# Patient Record
Sex: Male | Born: 1965 | Race: Black or African American | Hispanic: No | Marital: Married | Smoking: Current every day smoker
Health system: Southern US, Community
[De-identification: ages and names within clinical notes are randomized; demographics above are authoritative.]

## PROBLEM LIST (undated history)

## (undated) DIAGNOSIS — F419 Anxiety disorder, unspecified: Secondary | ICD-10-CM

## (undated) DIAGNOSIS — R519 Headache, unspecified: Secondary | ICD-10-CM

## (undated) DIAGNOSIS — R51 Headache: Secondary | ICD-10-CM

## (undated) HISTORY — PX: OTHER SURGICAL HISTORY: SHX169

## (undated) HISTORY — DX: Headache: R51

## (undated) HISTORY — DX: Headache, unspecified: R51.9

## (undated) HISTORY — DX: Anxiety disorder, unspecified: F41.9

---

## 1999-03-30 ENCOUNTER — Emergency Department (HOSPITAL_COMMUNITY): Admission: EM | Admit: 1999-03-30 | Discharge: 1999-03-30 | Payer: Self-pay | Admitting: Emergency Medicine

## 2000-01-10 ENCOUNTER — Inpatient Hospital Stay (HOSPITAL_COMMUNITY): Admission: EM | Admit: 2000-01-10 | Discharge: 2000-01-12 | Payer: Self-pay | Admitting: Emergency Medicine

## 2000-01-10 ENCOUNTER — Encounter: Payer: Self-pay | Admitting: Emergency Medicine

## 2000-01-28 ENCOUNTER — Encounter: Payer: Self-pay | Admitting: Neurological Surgery

## 2000-01-28 ENCOUNTER — Encounter: Admission: RE | Admit: 2000-01-28 | Discharge: 2000-01-28 | Payer: Self-pay | Admitting: Neurological Surgery

## 2000-02-02 ENCOUNTER — Encounter: Payer: Self-pay | Admitting: Neurological Surgery

## 2000-02-02 ENCOUNTER — Ambulatory Visit (HOSPITAL_COMMUNITY): Admission: RE | Admit: 2000-02-02 | Discharge: 2000-02-03 | Payer: Self-pay | Admitting: Neurological Surgery

## 2000-04-16 ENCOUNTER — Encounter: Payer: Self-pay | Admitting: Neurological Surgery

## 2000-04-16 ENCOUNTER — Encounter: Admission: RE | Admit: 2000-04-16 | Discharge: 2000-04-16 | Payer: Self-pay | Admitting: Neurological Surgery

## 2000-09-08 ENCOUNTER — Encounter: Admission: RE | Admit: 2000-09-08 | Discharge: 2000-09-22 | Payer: Self-pay | Admitting: Neurological Surgery

## 2001-08-31 HISTORY — PX: JOINT REPLACEMENT: SHX530

## 2005-11-06 ENCOUNTER — Encounter: Admission: RE | Admit: 2005-11-06 | Discharge: 2005-11-06 | Payer: Self-pay | Admitting: Radiation Oncology

## 2006-01-15 ENCOUNTER — Ambulatory Visit: Payer: Self-pay | Admitting: Infectious Diseases

## 2006-07-25 ENCOUNTER — Emergency Department (HOSPITAL_COMMUNITY): Admission: EM | Admit: 2006-07-25 | Discharge: 2006-07-25 | Payer: Self-pay | Admitting: Emergency Medicine

## 2007-05-13 ENCOUNTER — Encounter: Admission: RE | Admit: 2007-05-13 | Discharge: 2007-05-13 | Payer: Self-pay | Admitting: Orthopedic Surgery

## 2008-08-23 ENCOUNTER — Emergency Department (HOSPITAL_COMMUNITY): Admission: EM | Admit: 2008-08-23 | Discharge: 2008-08-24 | Payer: Self-pay | Admitting: Emergency Medicine

## 2008-12-14 ENCOUNTER — Emergency Department (HOSPITAL_COMMUNITY): Admission: EM | Admit: 2008-12-14 | Discharge: 2008-12-15 | Payer: Self-pay | Admitting: Emergency Medicine

## 2008-12-18 ENCOUNTER — Emergency Department (HOSPITAL_COMMUNITY): Admission: EM | Admit: 2008-12-18 | Discharge: 2008-12-18 | Payer: Self-pay | Admitting: Emergency Medicine

## 2009-04-23 ENCOUNTER — Emergency Department (HOSPITAL_COMMUNITY): Admission: EM | Admit: 2009-04-23 | Discharge: 2009-04-23 | Payer: Self-pay | Admitting: Emergency Medicine

## 2011-01-11 ENCOUNTER — Emergency Department: Payer: Self-pay | Admitting: Emergency Medicine
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
## 2011-01-16 NOTE — Discharge Summary (Signed)
[place]. [facility]  Patient:    Shawn Miller, Shawn Miller                       MRN: [id number] Adm. Date:  [id number] Disc. Date: [id number] Attending:  Jonne Ply CC:         Stefani Dama, M.D.                           Discharge Summary  ADMITTING DIAGNOSIS:  Cervical fracture dislocation of C5-C6 with left C6                       radiculopathy.  DISCHARGE DIAGNOSIS:  Cervical fracture dislocation of C5-C6 with left C6                       radiculopathy.  PROCEDURE:  Anterior cervical diskectomy and arthrodesis of C5-C6, fixation with Synthes.  SURGEON:  Dr. Danielle Dess, assistant Dr. Roxan Hockey.  CONDITION ON DISCHARGE:  Improved.  HISTORY OF PRESENT ILLNESS:  The patient is a 45 year old individual who has had significant neck, shoulder and left arm pain.  He was advised regarding surgical decompression of his fracture dislocation which has progressed in degenerative spondylitic changes at the C5-C6 level.  HOSPITAL COURSE:  He was taken to the operating room where he underwent an anterior diskectomy and arthrodesis.  He tolerated the procedure well.  His incision is clean and dry.  His swallowing was minimally effected.  DISCHARGE MEDICATIONS: 1. Vicodin #60 without refills. 2. Xanax 0.5 mg #40 without refills.  FOLLOWUP:  He will be seen in the office in one months time. DD:  02/03/00 TD:  02/06/00 Job: [id number]

## 2011-02-05 ENCOUNTER — Other Ambulatory Visit: Payer: Self-pay | Admitting: Neurological Surgery

## 2011-02-05 DIAGNOSIS — R519 Headache, unspecified: Secondary | ICD-10-CM

## 2011-02-09 ENCOUNTER — Ambulatory Visit
Admission: RE | Admit: 2011-02-09 | Discharge: 2011-02-09 | Disposition: A | Discharge status: Home / Self Care | Payer: 59 | Source: Ambulatory Visit | Attending: Neurological Surgery | Admitting: Neurological Surgery

## 2011-02-09 DIAGNOSIS — R519 Headache, unspecified: Secondary | ICD-10-CM

## 2012-03-30 ENCOUNTER — Emergency Department (HOSPITAL_COMMUNITY)
Admission: EM | Admit: 2012-03-30 | Discharge: 2012-03-30 | Disposition: A | Discharge status: Home / Self Care | Payer: 59 | Attending: Emergency Medicine | Admitting: Emergency Medicine

## 2012-03-30 DIAGNOSIS — F419 Anxiety disorder, unspecified: Secondary | ICD-10-CM

## 2012-03-30 DIAGNOSIS — R002 Palpitations: Secondary | ICD-10-CM

## 2012-03-30 DIAGNOSIS — F17200 Nicotine dependence, unspecified, uncomplicated: Secondary | ICD-10-CM | POA: Insufficient documentation

## 2012-03-30 DIAGNOSIS — F411 Generalized anxiety disorder: Secondary | ICD-10-CM | POA: Insufficient documentation

## 2012-03-30 LAB — BASIC METABOLIC PANEL
Calcium: 9.2 mg/dL (ref 8.4–10.5)
Chloride: 104 mEq/L (ref 96–112)
Creatinine, Ser: 1.04 mg/dL (ref 0.50–1.35)
GFR calc Af Amer: >90 mL/min (ref >90)

## 2012-03-30 LAB — TROPONIN I: Troponin I: <0.3 ng/mL (ref <0.30)

## 2012-03-30 LAB — CBC
Platelets: 305 10*3/uL (ref 150–400)
RDW: 16.2 % — ABNORMAL HIGH (ref 11.5–15.5)
WBC: 11.5 10*3/uL — ABNORMAL HIGH (ref 4.0–10.5)

## 2012-03-30 MED ORDER — ALPRAZOLAM 0.5 MG PO TABS: 0.5000 mg | ORAL_TABLET | Freq: Three times a day (TID) | ORAL | Status: AC | PRN

## 2012-03-30 NOTE — ED Notes (Signed)
Per ems pt denies any medical hx, nkda, takes a pink pill for his stomach

## 2012-03-30 NOTE — ED Provider Notes (Addendum)
History     CSN: [id number]  Arrival date & time 03/30/12  1449   First MD Initiated Contact with Patient 03/30/12 1735      Chief Complaint  Patient presents with  . Irregular Heart Beat    (Consider location/radiation/quality/duration/timing/severity/associated sxs/prior treatment) The history is provided by the patient.   he has a history of anxiety, and he smokes cigarettes.  He presents to emergency department complaining of an anxiety attack.  That began.  This morning.  He, says that he had a bite a [employer] burger and April.  Refill, and then, his body became, and tingling.  He said it was 10 times worse than his normal.  Anxiety attacks.  His numbness was associated with palpitations.  He denied pain anywhere.  He denied nausea, or vomiting, lightheadedness, or shortness of breath.  He, says his symptoms lasted from this morning until around 3:00.  He, says his symptoms are gone now.  He denies recent illness.  He denies cough, fevers, chills, sweating, rash, swelling, or weight loss.  He takes no medicines and has no allergies to medications  No past medical history on file.  No past surgical history on file.  No family history on file.  History  Substance Use Topics  . Smoking status: Not on file  . Smokeless tobacco: Not on file  . Alcohol Use: Not on file      Review of Systems  Constitutional: Negative for fever, chills and diaphoresis.  HENT: Negative for neck pain.   Eyes: Negative for redness.  Respiratory: Negative for cough and shortness of breath.   Cardiovascular: Positive for palpitations. Negative for chest pain and leg swelling.  Gastrointestinal: Negative for nausea, vomiting and abdominal pain.  Genitourinary: Negative for dysuria.  Musculoskeletal: Positive for joint swelling. Negative for back pain.  Skin: Negative for rash.  Neurological: Positive for numbness. Negative for weakness and headaches.  Hematological: Negative for adenopathy. Does  not bruise/bleed easily.  Psychiatric/Behavioral: Negative for confusion.  All other systems reviewed and are negative.    Allergies  Review of patient's allergies indicates not on file.  Home Medications  No current outpatient prescriptions on file.  BP 110/73  Pulse 84  Temp 97.8 F (36.6 C) (Oral)  Resp 16  SpO2 98%  Physical Exam  Nursing note and vitals reviewed. Constitutional: He is oriented to person, place, and time. He appears well-developed and well-nourished. No distress.  HENT:  Head: Normocephalic and atraumatic.  Eyes: Conjunctivae are normal.  Neck: Normal range of motion. Neck supple. No tracheal deviation present.  Cardiovascular: Normal rate and regular rhythm.   No murmur heard. Pulmonary/Chest: Effort normal and breath sounds normal. No respiratory distress. He has no wheezes. He has no rales.  Abdominal: Soft. Bowel sounds are normal. He exhibits no distension. There is no tenderness. There is no rebound and no guarding.  Musculoskeletal: Normal range of motion. He exhibits no edema and no tenderness.  Neurological: He is alert and oriented to person, place, and time.  Skin: Skin is warm and dry.  Psychiatric: He has a normal mood and affect. Thought content normal.    ED Course  Procedures (including critical care time) 46 year old, male, with a history of anxiety attacks, presents to emergency department following an anxiety attack.  Today.  He is asymptomatic.  Right now.  His physical examination is normal.  Right.  Now.  We will perform laboratory testing, for evaluation.  Since they have party.  Been ordered prior  to my interviewing the patient  Labs Reviewed  CBC - Abnormal; Notable for the following:    WBC 11.5 (*)     RDW 16.2 (*)     All other components within normal limits  TROPONIN I  BASIC METABOLIC PANEL   No results found.   No diagnosis found.  ECG. Normal sinus rhythm at 73 beats per minute. Normal axis. Normal  intervals. Nonspecific very subtle ST segment elevation. Impression normal sinus rhythm with no significant change from 07/25/2006  MDM  Anxiety reaction, resolved No evidence of acs or pulm dz.  asx in ed. Nl pe in ed        Cheri Guppy, MD 03/30/12 1751  Cheri Guppy, MD 03/30/12 [phone]

## 2012-03-30 NOTE — ED Notes (Signed)
Patient claims "I'm shaking all over on the inside, I'm very anxious.  I have a lot of anxiety".

## 2012-03-30 NOTE — ED Notes (Signed)
Tech at bedside for EKG.
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
## 2012-03-30 NOTE — ED Notes (Signed)
Care transferred and report given to Strickland, RN. 

## 2015-08-12 ENCOUNTER — Ambulatory Visit (INDEPENDENT_AMBULATORY_CARE_PROVIDER_SITE_OTHER): Payer: 59 | Admitting: Family Medicine

## 2015-08-12 VITALS — BP 122/72 | HR 87 | Temp 98.5°F | Resp 17 | Ht 74.5 in | Wt 258.0 lb

## 2015-08-12 DIAGNOSIS — Z299 Encounter for prophylactic measures, unspecified: Secondary | ICD-10-CM | POA: Diagnosis not present

## 2015-08-12 DIAGNOSIS — F411 Generalized anxiety disorder: Secondary | ICD-10-CM

## 2015-08-12 MED ORDER — ALPRAZOLAM 0.5 MG PO TBDP: 0.5000 mg | ORAL_TABLET | Freq: Every day | ORAL | Status: DC | PRN
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
## 2015-08-12 NOTE — Patient Instructions (Signed)

## 2015-10-21 ENCOUNTER — Encounter: Payer: 59 | Admitting: Family Medicine

## 2015-10-23 ENCOUNTER — Encounter: Payer: 59 | Admitting: Family Medicine

## 2015-10-30 ENCOUNTER — Ambulatory Visit (INDEPENDENT_AMBULATORY_CARE_PROVIDER_SITE_OTHER): Payer: 59 | Admitting: Family Medicine

## 2015-10-30 ENCOUNTER — Encounter: Payer: Self-pay | Admitting: Family Medicine

## 2015-10-30 VITALS — BP 106/64 | HR 86 | Temp 98.0°F | Resp 16 | Ht 73.25 in | Wt 251.4 lb

## 2015-10-30 DIAGNOSIS — Z13220 Encounter for screening for lipoid disorders: Secondary | ICD-10-CM

## 2015-10-30 DIAGNOSIS — Z1159 Encounter for screening for other viral diseases: Secondary | ICD-10-CM | POA: Diagnosis not present

## 2015-10-30 DIAGNOSIS — Z125 Encounter for screening for malignant neoplasm of prostate: Secondary | ICD-10-CM

## 2015-10-30 DIAGNOSIS — Z0000 Encounter for general adult medical examination without abnormal findings: Secondary | ICD-10-CM

## 2015-10-30 DIAGNOSIS — F411 Generalized anxiety disorder: Secondary | ICD-10-CM

## 2015-10-30 DIAGNOSIS — E669 Obesity, unspecified: Secondary | ICD-10-CM | POA: Diagnosis not present

## 2015-10-30 DIAGNOSIS — Z131 Encounter for screening for diabetes mellitus: Secondary | ICD-10-CM | POA: Diagnosis not present

## 2015-10-30 DIAGNOSIS — Z1211 Encounter for screening for malignant neoplasm of colon: Secondary | ICD-10-CM | POA: Diagnosis not present

## 2015-10-30 DIAGNOSIS — Z720 Tobacco use: Secondary | ICD-10-CM | POA: Diagnosis not present

## 2015-10-30 DIAGNOSIS — R142 Eructation: Secondary | ICD-10-CM | POA: Diagnosis not present

## 2015-10-30 LAB — COMPLETE METABOLIC PANEL WITH GFR
ALT: 27 U/L (ref 9–46)
AST: 19 U/L (ref 10–35)
Albumin: 4.7 g/dL (ref 3.6–5.1)
Alkaline Phosphatase: 104 U/L (ref 40–115)
BILIRUBIN TOTAL: 0.3 mg/dL (ref 0.2–1.2)
BUN: 16 mg/dL (ref 7–25)
CALCIUM: 9.3 mg/dL (ref 8.6–10.3)
CHLORIDE: 103 mmol/L (ref 98–110)
CO2: 26 mmol/L (ref 20–31)
CREATININE: 1.17 mg/dL (ref 0.70–1.33)
GFR, EST AFRICAN AMERICAN: 83 mL/min (ref >=60)
GFR, Est Non African American: 72 mL/min (ref >=60)
Glucose, Bld: 78 mg/dL (ref 65–99)
Potassium: 4.5 mmol/L (ref 3.5–5.3)
Sodium: 140 mmol/L (ref 135–146)
TOTAL PROTEIN: 7.8 g/dL (ref 6.1–8.1)

## 2015-10-30 LAB — LIPID PANEL
CHOLESTEROL: 206 mg/dL — AB (ref 125–200)
HDL: 38 mg/dL — ABNORMAL LOW (ref >=40)
LDL CALC: 142 mg/dL — AB (ref <130)
TRIGLYCERIDES: 131 mg/dL (ref <150)
Total CHOL/HDL Ratio: 5.4 Ratio — ABNORMAL HIGH (ref <=5.0)
VLDL: 26 mg/dL (ref <30)

## 2015-10-30 NOTE — Patient Instructions (Addendum)
Avoid foods below that can cause heartburn and belching.  If not improving your symptoms in the next few weeks - follow up to discuss these symptoms further.   See information on anxiety below. Continue exercise to help manage anxiety and weight loss. Decrease fast food and sugar containing beverages.  Follow up in next 3 months to discuss this further.   You should receive a call or letter about your lab results within the next week to 10 days.   I will refer you for colonoscopy.   Return to the clinic or go to the nearest emergency room if any of your symptoms worsen or new symptoms occur.  [place] offers smoking cessation clinics. Registration is required. To register call [phone] or register online at [email or web address].  Food Choices for Gastroesophageal Reflux Disease, Adult When you have gastroesophageal reflux disease (GERD), the foods you eat and your eating habits are very important. Choosing the right foods can help ease the discomfort of GERD. WHAT GENERAL GUIDELINES DO I NEED TO FOLLOW?  Choose fruits, vegetables, whole grains, low-fat dairy products, and low-fat meat, fish, and poultry.  Limit fats such as oils, salad dressings, butter, nuts, and avocado.  Keep a food diary to identify foods that cause symptoms.  Avoid foods that cause reflux. These may be different for different people.  Eat frequent small meals instead of three large meals each day.  Eat your meals slowly, in a relaxed setting.  Limit fried foods.  Cook foods using methods other than frying.  Avoid drinking alcohol.  Avoid drinking large amounts of liquids with your meals.  Avoid bending over or lying down until 2-3 hours after eating. WHAT FOODS ARE NOT RECOMMENDED? The following are some foods and drinks that may worsen your symptoms: Vegetables Tomatoes. Tomato juice. Tomato and spaghetti sauce. Chili peppers. Onion and garlic. Horseradish. Fruits Oranges, grapefruit, and lemon (fruit  and juice). Meats High-fat meats, fish, and poultry. This includes hot dogs, ribs, ham, sausage, salami, and bacon. Dairy Whole milk and chocolate milk. Sour cream. Cream. Butter. Ice cream. Cream cheese.  Beverages Coffee and tea, with or without caffeine. Carbonated beverages or energy drinks. Condiments Hot sauce. Barbecue sauce.  Sweets/Desserts Chocolate and cocoa. Donuts. Peppermint and spearmint. Fats and Oils High-fat foods, including [place] fries and potato chips. Other Vinegar. Strong spices, such as black pepper, white pepper, red pepper, cayenne, curry powder, cloves, ginger, and chili powder. The items listed above may not be a complete list of foods and beverages to avoid. Contact your dietitian for more information.   This information is not intended to replace advice given to you by your health care provider. Make sure you discuss any questions you have with your health care provider.   Document Released: 08/17/2005 Document Revised: 09/07/2014 Document Reviewed: 06/21/2013 Elsevier Interactive Patient Education 2016 [employer].   Smoking Cessation, Tips for Success If you are ready to quit smoking, congratulations! You have chosen to help yourself be healthier. Cigarettes bring nicotine, tar, carbon monoxide, and other irritants into your body. Your lungs, heart, and blood vessels will be able to work better without these poisons. There are many different ways to quit smoking. Nicotine gum, nicotine patches, a nicotine inhaler, or nicotine nasal spray can help with physical craving. Hypnosis, support groups, and medicines help break the habit of smoking. WHAT THINGS CAN I DO TO MAKE QUITTING EASIER?  Here are some tips to help you quit for good:  Pick a date when you will quit  smoking completely. Tell all of your friends and family about your plan to quit on that date.  Do not try to slowly cut down on the number of cigarettes you are smoking. Pick a quit date and  quit smoking completely starting on that day.  Throw away all cigarettes.   Clean and remove all ashtrays from your home, work, and car.  On a card, write down your reasons for quitting. Carry the card with you and read it when you get the urge to smoke.  Cleanse your body of nicotine. Drink enough water and fluids to keep your urine clear or pale yellow. Do this after quitting to flush the nicotine from your body.  Learn to predict your moods. Do not let a bad situation be your excuse to have a cigarette. Some situations in your life might tempt you into wanting a cigarette.  Never have "just one" cigarette. It leads to wanting another and another. Remind yourself of your decision to quit.  Change habits associated with smoking. If you smoked while driving or when feeling stressed, try other activities to replace smoking. Stand up when drinking your coffee. Brush your teeth after eating. Sit in a different chair when you read the paper. Avoid alcohol while trying to quit, and try to drink fewer caffeinated beverages. Alcohol and caffeine may urge you to smoke.  Avoid foods and drinks that can trigger a desire to smoke, such as sugary or spicy foods and alcohol.  Ask people who smoke not to smoke around you.  Have something planned to do right after eating or having a cup of coffee. For example, plan to take a walk or exercise.  Try a relaxation exercise to calm you down and decrease your stress. Remember, you may be tense and nervous for the first 2 weeks after you quit, but this will pass.  Find new activities to keep your hands busy. Play with a pen, coin, or rubber band. Doodle or draw things on paper.  Brush your teeth right after eating. This will help cut down on the craving for the taste of tobacco after meals. You can also try mouthwash.   Use oral substitutes in place of cigarettes. Try using lemon drops, carrots, cinnamon sticks, or chewing gum. Keep them handy so they are  available when you have the urge to smoke.  When you have the urge to smoke, try deep breathing.  Designate your home as a nonsmoking area.  If you are a heavy smoker, ask your health care provider about a prescription for nicotine chewing gum. It can ease your withdrawal from nicotine.  Reward yourself. Set aside the cigarette money you save and buy yourself something nice.  Look for support from others. Join a support group or smoking cessation program. Ask someone at home or at work to help you with your plan to quit smoking.  Always ask yourself, "Do I need this cigarette or is this just a reflex?" Tell yourself, "Today, I choose not to smoke," or "I do not want to smoke." You are reminding yourself of your decision to quit.  Do not replace cigarette smoking with electronic cigarettes (commonly called e-cigarettes). The safety of e-cigarettes is unknown, and some may contain harmful chemicals.  If you relapse, do not give up! Plan ahead and think about what you will do the next time you get the urge to smoke. HOW WILL I FEEL WHEN I QUIT SMOKING? You may have symptoms of withdrawal because your body is  used to nicotine (the addictive substance in cigarettes). You may crave cigarettes, be irritable, feel very hungry, cough often, get headaches, or have difficulty concentrating. The withdrawal symptoms are only temporary. They are strongest when you first quit but will go away within 10-14 days. When withdrawal symptoms occur, stay in control. Think about your reasons for quitting. Remind yourself that these are signs that your body is healing and getting used to being without cigarettes. Remember that withdrawal symptoms are easier to treat than the major diseases that smoking can cause.  Even after the withdrawal is over, expect periodic urges to smoke. However, these cravings are generally short lived and will go away whether you smoke or not. Do not smoke! WHAT RESOURCES ARE AVAILABLE TO  HELP ME QUIT SMOKING? Your health care provider can direct you to community resources or hospitals for support, which may include:  Group support.  Education.  Hypnosis.  Therapy.   This information is not intended to replace advice given to you by your health care provider. Make sure you discuss any questions you have with your health care provider.   Document Released: 05/15/2004 Document Revised: 09/07/2014 Document Reviewed: 02/02/2013 Elsevier Interactive Patient Education 2016 [employer].   Keeping you healthy  Get these tests  Blood pressure- Have your blood pressure checked once a year by your healthcare provider.  Normal blood pressure is 120/80  Weight- Have your body mass index (BMI) calculated to screen for obesity.  BMI is a measure of body fat based on height and weight. You can also calculate your own BMI at [email or web address].  Cholesterol- Have your cholesterol checked every year.  Diabetes- Have your blood sugar checked regularly if you have high blood pressure, high cholesterol, have a family history of diabetes or if you are overweight.  Screening for Colon Cancer- Colonoscopy starting at age 23.  Screening may begin sooner depending on your family history and other health conditions. Follow up colonoscopy as directed by your Gastroenterologist.  Screening for Prostate Cancer- Both blood work (PSA) and a rectal exam help screen for Prostate Cancer.  Screening begins at age 38 with African-American men and at age 71 with Caucasian men.  Screening may begin sooner depending on your family history.  Take these medicines  Aspirin- One aspirin daily can help prevent Heart disease and Stroke.  Flu shot- Every fall.  Tetanus- Every 10 years.  Zostavax- Once after the age of 75 to prevent Shingles.  Pneumonia shot- Once after the age of 62; if you are younger than 3, ask your healthcare provider if you need a Pneumonia shot.  Take these  steps  Don't smoke- If you do smoke, talk to your doctor about quitting.  For tips on how to quit, go to www.smokefree.gov or call 1-800-QUIT-NOW.  Be physically active- Exercise 5 days a week for at least 30 minutes.  If you are not already physically active start slow and gradually work up to 30 minutes of moderate physical activity.  Examples of moderate activity include walking briskly, mowing the yard, dancing, swimming, bicycling, etc.  Eat a healthy diet- Eat a variety of healthy food such as fruits, vegetables, low fat milk, low fat cheese, yogurt, lean meant, poultry, fish, beans, tofu, etc. For more information go to www.thenutritionsource.org  Drink alcohol in moderation- Limit alcohol intake to less than two drinks a day. Never drink and drive.  Dentist- Brush and floss twice daily; visit your dentist twice a year.  Depression- Your emotional health is  as important as your physical health. If you're feeling down, or losing interest in things you would normally enjoy please talk to your healthcare provider.  Eye exam- Visit your eye doctor every year.  Safe sex- If you may be exposed to a sexually transmitted infection, use a condom.  Seat belts- Seat belts can save your life; always wear one.  Smoke/Carbon Monoxide detectors- These detectors need to be installed on the appropriate level of your home.  Replace batteries at least once a year.  Skin cancer- When out in the sun, cover up and use sunscreen 15 SPF or higher.  Violence- If anyone is threatening you, please tell your healthcare provider.  Living Will/ Health care power of attorney- Speak with your healthcare provider and family.

## 2015-10-30 NOTE — Progress Notes (Addendum)
Subjective:    Patient ID: Shawn Miller, male    DOB: 10-25-1965, 50 y.o.   MRN: [id number] By signing my name below, I, Littie Deeds, attest that this documentation has been prepared under the direction and in the presence of Meredith Staggers, MD.  Electronically Signed: Littie Deeds, Medical Scribe. 10/30/2015. 12:36 PM.  HPI HPI Comments: Shawn Miller is a 50 y.o. male who presents to the [facility] for a complete physical exam. He is a new patient to me. Previously followed by Dr. Milus Glazier. He has a history of anxiety. Last seen in December. Uses Xanax as needed. Has used Niravam version (oral dissolvable tablet). Last prescription for Niravam on December 12th, #30 and 1 refill. Patient is fasting today.  Cancer screening:  Colon cancer screening - He did have a colonoscopy several years ago, but he does not remember who performed it. He is due for another colonoscopy and would like [place] to make a referral. Prostate cancer screening - Patient agrees to prostate cancer screening today.  Immunizations: He declines flu shot. He states his last tetanus was about 6 years ago.  Depression screening:  Depression screen [facility] 2/9 10/30/2015 08/12/2015  Decreased Interest 0 0  Down, Depressed, Hopeless 0 0  PHQ - 2 Score 0 0   Fall screening: 0 falls in the past year.  Vision: Patient states he is due to see his eye doctor.  Visual Acuity Screening   Right eye Left eye Both eyes  Without correction:     With correction: 20/20 20/20 20/20     Exercise: Walks twice a week, 1-2 miles at a time.  Diet: He reports eating fast food about 3 times a week and drinking about 3 cans of ginger ale a day.  Dentist: He sees a Education officer, community every year.  HIV screening: He was tested for HIV when he was married. He has not had any new sexual partners since then.  Hepatitis C screening: He does not believe he has been tested for hepatitis C.  Lipid screening: No recent lab  work.  Belching: Patient has had increased belching over the past few months. He believes this may be due to his anxiety. Patient denies heartburn.  Anxiety: He takes the Niravam about 1-2 times a week which has been helping to control his anxiety. He has not yet refilled the Niravam. Patient denies side effects. The walking also helps with his anxiety.  Smoking cessation: Patient has been working on quitting smoking and is down to 0.5 ppd. He cannot tolerate the nicotine gum and notes the Chantix gives him nightmares.  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  There are no active problems to display for this patient.  Past Medical History  Diagnosis Date  . Anxiety    Past Surgical History  Procedure Laterality Date  . Joint replacement  2003    per patient neck-3 screws and 3 plate   No Known Allergies Prior to Admission medications   Medication Sig Start Date End Date Taking? Authorizing Provider  ALPRAZolam (NIRAVAM) 0.5 MG dissolvable tablet Take 1 tablet (0.5 mg total) by mouth daily as needed for anxiety. 08/12/15  Yes Elvina Sidle, MD   Social History   Social History  . Marital Status: Married    Spouse Name: N/A  . Number of Children: N/A  . Years of Education: N/A   Occupational History  . transportation    Social History Main Topics  .  Smoking status: Current Every Day Smoker -- 0.50 packs/day for 33 years    Types: Cigarettes  . Smokeless tobacco: Not on file  . Alcohol Use: 3.0 oz/week    5 Standard drinks or equivalent per week     Comment: liquor and beer  . Drug Use: No  . Sexual Activity: Not on file   Other Topics Concern  . Not on file   Social History Narrative   Exercise walking 2 miles two times/week     Review of Systems  HENT: Positive for tinnitus.   Musculoskeletal: Positive for neck stiffness.  All other systems reviewed and are negative. 13 point ROS reviewed on patient health survey. Negative other  than listed above or in nursing note. See nursing note.     Objective:   Physical Exam  Constitutional: He is oriented to person, place, and time. He appears well-developed and well-nourished.  HENT:  Head: Normocephalic and atraumatic.  Right Ear: External ear normal.  Left Ear: External ear normal.  Mouth/Throat: Oropharynx is clear and moist.  Eyes: Conjunctivae and EOM are normal. Pupils are equal, round, and reactive to light.  Neck: Normal range of motion. Neck supple. No thyromegaly present.  Cardiovascular: Normal rate, regular rhythm, normal heart sounds and intact distal pulses.   Pulmonary/Chest: Effort normal and breath sounds normal. No respiratory distress. He has no wheezes.  Abdominal: Soft. He exhibits no distension. There is no tenderness. Hernia confirmed negative in the right inguinal area and confirmed negative in the left inguinal area.  Genitourinary: Prostate normal.  Musculoskeletal: Normal range of motion. He exhibits no edema or tenderness.  Lymphadenopathy:    He has no cervical adenopathy.  Neurological: He is alert and oriented to person, place, and time. He has normal reflexes.  Skin: Skin is warm and dry.  Psychiatric: He has a normal mood and affect. His behavior is normal.  Vitals reviewed.   Filed Vitals:   10/30/15 1129  BP: 106/64  Pulse: 86  Temp: 98 F (36.7 C)  TempSrc: Oral  Resp: 16  Height: 6' 1.25" (1.861 m)  Weight: 251 lb 6.4 oz (114.034 kg)  SpO2: 98%  Body mass index is 32.93 kg/(m^2).       Assessment & Plan:  Shawn Miller is a 50 y.o. male Annual physical exam  --anticipatory guidance as below in AVS, screening labs above. Health maintenance items as above in HPI discussed/recommended as applicable.   Anxiety state  -with intermittent dosing with Niravam. Management techniques discussed.  Screening for diabetes mellitus - Plan: COMPLETE METABOLIC PANEL WITH GFR  Screening for hyperlipidemia - Plan: Lipid  panel  Obesity  -Increased activity, portion control.  Recheck 3 months.  Need for hepatitis C screening test - hep C antibody  Colon cancer screening  -Refer to GI for screening.   Belching  - May be related to reflux. Avoid trigger foods as below, return to discuss further if persistent.  Screening for prostate cancer - Plan: PSA  -We discussed pros and cons of prostate cancer screening, and after this discussion, he chose to have screening done. PSA obtained, and no concerning findings on DRE.   Tobacco abuse  -Cessation discussed. Resources provided.   No orders of the defined types were placed in this encounter.   Patient Instructions  Avoid foods below that can cause heartburn and belching.  If not improving your symptoms in the next few weeks - follow up to discuss these symptoms further.   See  information on anxiety below. Continue exercise to help manage anxiety and weight loss. Decrease fast food and sugar containing beverages.  Follow up in next 3 months to discuss this further.   You should receive a call or letter about your lab results within the next week to 10 days.   I will refer you for colonoscopy.   Return to the clinic or go to the nearest emergency room if any of your symptoms worsen or new symptoms occur.  [place] offers smoking cessation clinics. Registration is required. To register call [phone] or register online at [email or web address].  Food Choices for Gastroesophageal Reflux Disease, Adult When you have gastroesophageal reflux disease (GERD), the foods you eat and your eating habits are very important. Choosing the right foods can help ease the discomfort of GERD. WHAT GENERAL GUIDELINES DO I NEED TO FOLLOW?  Choose fruits, vegetables, whole grains, low-fat dairy products, and low-fat meat, fish, and poultry.  Limit fats such as oils, salad dressings, butter, nuts, and avocado.  Keep a food diary to identify foods that cause  symptoms.  Avoid foods that cause reflux. These may be different for different people.  Eat frequent small meals instead of three large meals each day.  Eat your meals slowly, in a relaxed setting.  Limit fried foods.  Cook foods using methods other than frying.  Avoid drinking alcohol.  Avoid drinking large amounts of liquids with your meals.  Avoid bending over or lying down until 2-3 hours after eating. WHAT FOODS ARE NOT RECOMMENDED? The following are some foods and drinks that may worsen your symptoms: Vegetables Tomatoes. Tomato juice. Tomato and spaghetti sauce. Chili peppers. Onion and garlic. Horseradish. Fruits Oranges, grapefruit, and lemon (fruit and juice). Meats High-fat meats, fish, and poultry. This includes hot dogs, ribs, ham, sausage, salami, and bacon. Dairy Whole milk and chocolate milk. Sour cream. Cream. Butter. Ice cream. Cream cheese.  Beverages Coffee and tea, with or without caffeine. Carbonated beverages or energy drinks. Condiments Hot sauce. Barbecue sauce.  Sweets/Desserts Chocolate and cocoa. Donuts. Peppermint and spearmint. Fats and Oils High-fat foods, including [place] fries and potato chips. Other Vinegar. Strong spices, such as black pepper, white pepper, red pepper, cayenne, curry powder, cloves, ginger, and chili powder. The items listed above may not be a complete list of foods and beverages to avoid. Contact your dietitian for more information.   This information is not intended to replace advice given to you by your health care provider. Make sure you discuss any questions you have with your health care provider.   Document Released: 08/17/2005 Document Revised: 09/07/2014 Document Reviewed: 06/21/2013 Elsevier Interactive Patient Education 2016 [employer].   Smoking Cessation, Tips for Success If you are ready to quit smoking, congratulations! You have chosen to help yourself be healthier. Cigarettes bring nicotine, tar,  carbon monoxide, and other irritants into your body. Your lungs, heart, and blood vessels will be able to work better without these poisons. There are many different ways to quit smoking. Nicotine gum, nicotine patches, a nicotine inhaler, or nicotine nasal spray can help with physical craving. Hypnosis, support groups, and medicines help break the habit of smoking. WHAT THINGS CAN I DO TO MAKE QUITTING EASIER?  Here are some tips to help you quit for good:  Pick a date when you will quit smoking completely. Tell all of your friends and family about your plan to quit on that date.  Do not try to slowly cut down on the number of cigarettes  you are smoking. Pick a quit date and quit smoking completely starting on that day.  Throw away all cigarettes.   Clean and remove all ashtrays from your home, work, and car.  On a card, write down your reasons for quitting. Carry the card with you and read it when you get the urge to smoke.  Cleanse your body of nicotine. Drink enough water and fluids to keep your urine clear or pale yellow. Do this after quitting to flush the nicotine from your body.  Learn to predict your moods. Do not let a bad situation be your excuse to have a cigarette. Some situations in your life might tempt you into wanting a cigarette.  Never have "just one" cigarette. It leads to wanting another and another. Remind yourself of your decision to quit.  Change habits associated with smoking. If you smoked while driving or when feeling stressed, try other activities to replace smoking. Stand up when drinking your coffee. Brush your teeth after eating. Sit in a different chair when you read the paper. Avoid alcohol while trying to quit, and try to drink fewer caffeinated beverages. Alcohol and caffeine may urge you to smoke.  Avoid foods and drinks that can trigger a desire to smoke, such as sugary or spicy foods and alcohol.  Ask people who smoke not to smoke around you.  Have  something planned to do right after eating or having a cup of coffee. For example, plan to take a walk or exercise.  Try a relaxation exercise to calm you down and decrease your stress. Remember, you may be tense and nervous for the first 2 weeks after you quit, but this will pass.  Find new activities to keep your hands busy. Play with a pen, coin, or rubber band. Doodle or draw things on paper.  Brush your teeth right after eating. This will help cut down on the craving for the taste of tobacco after meals. You can also try mouthwash.   Use oral substitutes in place of cigarettes. Try using lemon drops, carrots, cinnamon sticks, or chewing gum. Keep them handy so they are available when you have the urge to smoke.  When you have the urge to smoke, try deep breathing.  Designate your home as a nonsmoking area.  If you are a heavy smoker, ask your health care provider about a prescription for nicotine chewing gum. It can ease your withdrawal from nicotine.  Reward yourself. Set aside the cigarette money you save and buy yourself something nice.  Look for support from others. Join a support group or smoking cessation program. Ask someone at home or at work to help you with your plan to quit smoking.  Always ask yourself, "Do I need this cigarette or is this just a reflex?" Tell yourself, "Today, I choose not to smoke," or "I do not want to smoke." You are reminding yourself of your decision to quit.  Do not replace cigarette smoking with electronic cigarettes (commonly called e-cigarettes). The safety of e-cigarettes is unknown, and some may contain harmful chemicals.  If you relapse, do not give up! Plan ahead and think about what you will do the next time you get the urge to smoke. HOW WILL I FEEL WHEN I QUIT SMOKING? You may have symptoms of withdrawal because your body is used to nicotine (the addictive substance in cigarettes). You may crave cigarettes, be irritable, feel very hungry,  cough often, get headaches, or have difficulty concentrating. The withdrawal symptoms are only  temporary. They are strongest when you first quit but will go away within 10-14 days. When withdrawal symptoms occur, stay in control. Think about your reasons for quitting. Remind yourself that these are signs that your body is healing and getting used to being without cigarettes. Remember that withdrawal symptoms are easier to treat than the major diseases that smoking can cause.  Even after the withdrawal is over, expect periodic urges to smoke. However, these cravings are generally short lived and will go away whether you smoke or not. Do not smoke! WHAT RESOURCES ARE AVAILABLE TO HELP ME QUIT SMOKING? Your health care provider can direct you to community resources or hospitals for support, which may include:  Group support.  Education.  Hypnosis.  Therapy.   This information is not intended to replace advice given to you by your health care provider. Make sure you discuss any questions you have with your health care provider.   Document Released: 05/15/2004 Document Revised: 09/07/2014 Document Reviewed: 02/02/2013 Elsevier Interactive Patient Education 2016 [employer].   Keeping you healthy  Get these tests  Blood pressure- Have your blood pressure checked once a year by your healthcare provider.  Normal blood pressure is 120/80  Weight- Have your body mass index (BMI) calculated to screen for obesity.  BMI is a measure of body fat based on height and weight. You can also calculate your own BMI at [email or web address].  Cholesterol- Have your cholesterol checked every year.  Diabetes- Have your blood sugar checked regularly if you have high blood pressure, high cholesterol, have a family history of diabetes or if you are overweight.  Screening for Colon Cancer- Colonoscopy starting at age 45.  Screening may begin sooner depending on your family history and other health  conditions. Follow up colonoscopy as directed by your Gastroenterologist.  Screening for Prostate Cancer- Both blood work (PSA) and a rectal exam help screen for Prostate Cancer.  Screening begins at age 51 with African-American men and at age 84 with Caucasian men.  Screening may begin sooner depending on your family history.  Take these medicines  Aspirin- One aspirin daily can help prevent Heart disease and Stroke.  Flu shot- Every fall.  Tetanus- Every 10 years.  Zostavax- Once after the age of 58 to prevent Shingles.  Pneumonia shot- Once after the age of 3; if you are younger than 59, ask your healthcare provider if you need a Pneumonia shot.  Take these steps  Don't smoke- If you do smoke, talk to your doctor about quitting.  For tips on how to quit, go to www.smokefree.gov or call 1-800-QUIT-NOW.  Be physically active- Exercise 5 days a week for at least 30 minutes.  If you are not already physically active start slow and gradually work up to 30 minutes of moderate physical activity.  Examples of moderate activity include walking briskly, mowing the yard, dancing, swimming, bicycling, etc.  Eat a healthy diet- Eat a variety of healthy food such as fruits, vegetables, low fat milk, low fat cheese, yogurt, lean meant, poultry, fish, beans, tofu, etc. For more information go to www.thenutritionsource.org  Drink alcohol in moderation- Limit alcohol intake to less than two drinks a day. Never drink and drive.  Dentist- Brush and floss twice daily; visit your dentist twice a year.  Depression- Your emotional health is as important as your physical health. If you're feeling down, or losing interest in things you would normally enjoy please talk to your healthcare provider.  Eye exam- Visit your eye  doctor every year.  Safe sex- If you may be exposed to a sexually transmitted infection, use a condom.  Seat belts- Seat belts can save your life; always wear one.  Smoke/Carbon  Monoxide detectors- These detectors need to be installed on the appropriate level of your home.  Replace batteries at least once a year.  Skin cancer- When out in the sun, cover up and use sunscreen 15 SPF or higher.  Violence- If anyone is threatening you, please tell your healthcare provider.  Living Will/ Health care power of attorney- Speak with your healthcare provider and family.    I personally performed the services described in this documentation, which was scribed in my presence. The recorded information has been reviewed and considered, and addended by me as needed.

## 2015-10-31 LAB — PSA: PSA: 1.04 ng/mL (ref <=4.00)

## 2015-11-06 ENCOUNTER — Encounter: Payer: 59 | Admitting: Family Medicine

## 2016-07-20 ENCOUNTER — Other Ambulatory Visit: Payer: Self-pay | Admitting: Family Medicine

## 2016-07-20 ENCOUNTER — Ambulatory Visit: Payer: 59

## 2016-07-20 DIAGNOSIS — F411 Generalized anxiety disorder: Secondary | ICD-10-CM

## 2016-07-21 ENCOUNTER — Ambulatory Visit (INDEPENDENT_AMBULATORY_CARE_PROVIDER_SITE_OTHER): Payer: 59 | Admitting: Urgent Care

## 2016-07-21 VITALS — BP 132/80 | HR 85 | Temp 97.6°F | Resp 17 | Ht 73.25 in | Wt 250.0 lb

## 2016-07-21 DIAGNOSIS — F410 Panic disorder [episodic paroxysmal anxiety] without agoraphobia: Secondary | ICD-10-CM

## 2016-07-21 DIAGNOSIS — F419 Anxiety disorder, unspecified: Secondary | ICD-10-CM

## 2016-07-21 DIAGNOSIS — M6289 Other specified disorders of muscle: Secondary | ICD-10-CM | POA: Diagnosis not present

## 2016-07-21 MED ORDER — SERTRALINE HCL 50 MG PO TABS: 50.0000 mg | ORAL_TABLET | Freq: Every day | ORAL | 3 refills | Status: DC

## 2016-07-21 MED ORDER — ALPRAZOLAM 0.5 MG PO TABS: 0.5000 mg | ORAL_TABLET | Freq: Every evening | ORAL | 1 refills | Status: DC | PRN

## 2016-07-21 MED ORDER — ALPRAZOLAM 0.5 MG PO TBDP: 0.5000 mg | ORAL_TABLET | Freq: Every day | ORAL | 1 refills | Status: DC | PRN

## 2016-07-21 NOTE — Progress Notes (Signed)
    MRN: [id number] DOB: [birth year]  Subjective:   Shawn Miller is a 50 y.o. male presenting for chief complaint of Medication Refill (niravam. Pt states he had a bad attack on Sunday, "felt like a seizure".)  Reports longstanding history of anxiety since early 2000's. Had his worst panic attack this past Sunday, started feeling stiff hands, stiff body, heart racing, panting, became very fearful. Felt like he was having a seizure, had numbness and tingling of his entire body. Patient had started eating [employer], started cleaning and started having his panic attack. He has received prescription for Xanax ODT in the past, had some left over and had 1 left. He took this and states that it helped. He cannot recall any inciting factors about his anxiety. Family history is positive for epilepsy. Denies SI, HI, depressed mood, irritability, insomnia, feelings of guilt, worrying easily. Has good relationships at home, has a good support network   Shawn Miller has a current medication list which includes the following prescription(s): alprazolam. Also has No Known Allergies.  Shawn Miller  has a past medical history of Anxiety. Also  has a past surgical history that includes Joint replacement (2003).  Objective:   Vitals: BP 132/80 (BP Location: Left Arm, Patient Position: Sitting, Cuff Size: Normal)   Pulse 85   Temp 97.6 F (36.4 C) (Oral)   Resp 17   Ht 6' 1.25" (1.861 m)   Wt 250 lb (113.4 kg)   SpO2 100%   BMI 32.76 kg/m   BP Readings from Last 3 Encounters:  07/21/16 132/80  10/30/15 106/64  08/12/15 122/72    Physical Exam  Constitutional: He is oriented to person, place, and time. He appears well-developed and well-nourished.  HENT:  Mouth/Throat: Oropharynx is clear and moist.  Eyes: EOM are normal. Pupils are equal, round, and reactive to light. No scleral icterus.  Cardiovascular: Normal rate, regular rhythm and intact distal pulses.  Exam reveals no gallop and no friction rub.     No murmur heard. Pulmonary/Chest: No respiratory distress. He has no wheezes. He has no rales.  Neurological: He is alert and oriented to person, place, and time.  Skin: Skin is warm and dry.   ECG interpretation - normal sinus rhythm.  Assessment and Plan :   1. Anxiety 2. Panic attacks 3. Muscle stiffness - Labs pending, will start patient on trial of Zoloft, Xanax as needed. Follow up in 4-6 weeks.  Shawn BambergMario Gladis Soley, PA-C [facility] [phone] 07/21/2016 5:34 PM

## 2016-07-21 NOTE — Patient Instructions (Addendum)
[facility] [address] [place], [place] [address]   Shanon Rosser [phone]  Maris Berger [phone]  Marco Collie [phone]    Sertraline tablets What is this medicine? SERTRALINE (SER tra leen) is used to treat depression. It may also be used to treat obsessive compulsive disorder, panic disorder, post-trauma stress, premenstrual dysphoric disorder (PMDD) or social anxiety. This medicine may be used for other purposes; ask your health care provider or pharmacist if you have questions. COMMON BRAND NAME(S): Zoloft What should I tell my health care provider before I take this medicine? They need to know if you have any of these conditions: -bleeding disorders -bipolar disorder or a family history of bipolar disorder -glaucoma -heart disease -high blood pressure -history of irregular heartbeat -history of low levels of calcium, magnesium, or potassium in the blood -if you often drink alcohol -liver disease -receiving electroconvulsive therapy -seizures -suicidal thoughts, plans, or attempt; a previous suicide attempt by you or a family member -take medicines that treat or prevent blood clots -thyroid disease -an unusual or allergic reaction to sertraline, other medicines, foods, dyes, or preservatives -pregnant or trying to get pregnant -breast-feeding How should I use this medicine? Take this medicine by mouth with a glass of water. Follow the directions on the prescription label. You can take it with or without food. Take your medicine at regular intervals. Do not take your medicine more often than directed. Do not stop taking this medicine suddenly except upon the advice of your doctor. Stopping this medicine too quickly may cause serious side effects or your condition may worsen. A special MedGuide will be given to you by the pharmacist with each prescription and refill. Be sure to read this information carefully each time. Talk to your  pediatrician regarding the use of this medicine in children. While this drug may be prescribed for children as young as 7 years for selected conditions, precautions do apply. Overdosage: If you think you have taken too much of this medicine contact a poison control center or emergency room at once. NOTE: This medicine is only for you. Do not share this medicine with others. What if I miss a dose? If you miss a dose, take it as soon as you can. If it is almost time for your next dose, take only that dose. Do not take double or extra doses. What may interact with this medicine? Do not take this medicine with any of the following medications: -certain medicines for fungal infections like fluconazole, itraconazole, ketoconazole, posaconazole, voriconazole -cisapride -disulfiram -dofetilide -linezolid -MAOIs like Carbex, Eldepryl, Marplan, Nardil, and Parnate -metronidazole -methylene blue (injected into a vein) -pimozide -thioridazine -ziprasidone This medicine may also interact with the following medications: -alcohol -amphetamines -aspirin and aspirin-like medicines -certain medicines for depression, anxiety, or psychotic disturbances -certain medicines for irregular heart beat like flecainide, propafenone -certain medicines for migraine headaches like almotriptan, eletriptan, frovatriptan, naratriptan, rizatriptan, sumatriptan, zolmitriptan -certain medicines for sleep -certain medicines for seizures like carbamazepine, valproic acid, phenytoin -certain medicines that treat or prevent blood clots like warfarin, enoxaparin, dalteparin -cimetidine -digoxin -diuretics -fentanyl -furazolidone -isoniazid -lithium -NSAIDs, medicines for pain and inflammation, like ibuprofen or naproxen -other medicines that prolong the QT interval (cause an abnormal heart rhythm) -procarbazine -rasagiline -supplements like St. John's wort, kava kava,  valerian -tolbutamide -tramadol -tryptophan This list may not describe all possible interactions. Give your health care provider a list of all the medicines, herbs, non-prescription drugs, or dietary supplements you use. Also tell them if you smoke, drink  alcohol, or use illegal drugs. Some items may interact with your medicine. What should I watch for while using this medicine? Tell your doctor if your symptoms do not get better or if they get worse. Visit your doctor or health care professional for regular checks on your progress. Because it may take several weeks to see the full effects of this medicine, it is important to continue your treatment as prescribed by your doctor. Patients and their families should watch out for new or worsening thoughts of suicide or depression. Also watch out for sudden changes in feelings such as feeling anxious, agitated, panicky, irritable, hostile, aggressive, impulsive, severely restless, overly excited and hyperactive, or not being able to sleep. If this happens, especially at the beginning of treatment or after a change in dose, call your health care professional. Bonita QuinYou may get drowsy or dizzy. Do not drive, use machinery, or do anything that needs mental alertness until you know how this medicine affects you. Do not stand or sit up quickly, especially if you are an older patient. This reduces the risk of dizzy or fainting spells. Alcohol may interfere with the effect of this medicine. Avoid alcoholic drinks. Your mouth may get dry. Chewing sugarless gum or sucking hard candy, and drinking plenty of water may help. Contact your doctor if the problem does not go away or is severe. What side effects may I notice from receiving this medicine? Side effects that you should report to your doctor or health care professional as soon as possible: -allergic reactions like skin rash, itching or hives, swelling of the face, lips, or tongue -anxious -black, tarry  stools -changes in vision -confusion -elevated mood, decreased need for sleep, racing thoughts, impulsive behavior -eye pain -fast, irregular heartbeat -feeling faint or lightheaded, falls -feeling agitated, angry, or irritable -hallucination, loss of contact with reality -loss of balance or coordination -loss of memory -painful or prolonged erections -restlessness, pacing, inability to keep still -seizures -stiff muscles -suicidal thoughts or other mood changes -trouble sleeping -unusual bleeding or bruising -unusually weak or tired -vomiting Side effects that usually do not require medical attention (report to your doctor or health care professional if they continue or are bothersome): -change in appetite or weight -change in sex drive or performance -diarrhea -increased sweating -indigestion, nausea -tremors This list may not describe all possible side effects. Call your doctor for medical advice about side effects. You may report side effects to FDA at 1-800-FDA-1088. Where should I keep my medicine? Keep out of the reach of children. Store at room temperature between 15 and 30 degrees C (59 and 86 degrees F). Throw away any unused medicine after the expiration date. NOTE: This sheet is a summary. It may not cover all possible information. If you have questions about this medicine, talk to your doctor, pharmacist, or health care provider.  2017 Elsevier/Gold Standard (2016-01-18 15:54:02)   Alprazolam tablets What is this medicine? ALPRAZOLAM (al PRAY zoe lam) is a benzodiazepine. It is used to treat anxiety and panic attacks. This medicine may be used for other purposes; ask your health care provider or pharmacist if you have questions. COMMON BRAND NAME(S): Xanax What should I tell my health care provider before I take this medicine? They need to know if you have any of these conditions: -an alcohol or drug abuse problem -bipolar disorder, depression, psychosis or  other mental health conditions -glaucoma -kidney or liver disease -lung or breathing disease -myasthenia gravis -Parkinson's disease -porphyria -seizures or a history of  seizures -suicidal thoughts -an unusual or allergic reaction to alprazolam, other benzodiazepines, foods, dyes, or preservatives -pregnant or trying to get pregnant -breast-feeding How should I use this medicine? Take this medicine by mouth with a glass of water. Follow the directions on the prescription label. Take your medicine at regular intervals. Do not take it more often than directed. Do not stop taking except on your doctor's advice. A special MedGuide will be given to you by the pharmacist with each prescription and refill. Be sure to read this information carefully each time. Talk to your pediatrician regarding the use of this medicine in children. Special care may be needed. Overdosage: If you think you have taken too much of this medicine contact a poison control center or emergency room at once. NOTE: This medicine is only for you. Do not share this medicine with others. What if I miss a dose? If you miss a dose, take it as soon as you can. If it is almost time for your next dose, take only that dose. Do not take double or extra doses. What may interact with this medicine? Do not take this medicine with any of the following medications: -certain antiviral medicines for HIV or AIDS like delavirdine, indinavir -certain medicines for fungal infections like ketoconazole and itraconazole -narcotic medicines for cough -sodium oxybate This medicine may also interact with the following medications: -alcohol -antihistamines for allergy, cough and cold -certain antibiotics like clarithromycin, erythromycin, isoniazid, rifampin, rifapentine, rifabutin, and troleandomycin -certain medicines for blood pressure, heart disease, irregular heart beat -certain medicines for depression, like amitriptyline, fluoxetine,  sertraline -certain medicines for seizures like carbamazepine, oxcarbazepine, phenobarbital, phenytoin, primidone -cimetidine -cyclosporine -male hormones, like estrogens or progestins and birth control pills, patches, rings, or injections -general anesthetics like halothane, isoflurane, methoxyflurane, propofol -grapefruit juice -local anesthetics like lidocaine, pramoxine, tetracaine -medicines that relax muscles for surgery -narcotic medicines for pain -other antiviral medicines for HIV or AIDS -phenothiazines like chlorpromazine, mesoridazine, prochlorperazine, thioridazine This list may not describe all possible interactions. Give your health care provider a list of all the medicines, herbs, non-prescription drugs, or dietary supplements you use. Also tell them if you smoke, drink alcohol, or use illegal drugs. Some items may interact with your medicine. What should I watch for while using this medicine? Tell your doctor or health care professional if your symptoms do not start to get better or if they get worse. Do not stop taking except on your doctor's advice. You may develop a severe reaction. Your doctor will tell you how much medicine to take. You may get drowsy or dizzy. Do not drive, use machinery, or do anything that needs mental alertness until you know how this medicine affects you. To reduce the risk of dizzy and fainting spells, do not stand or sit up quickly, especially if you are an older patient. Alcohol may increase dizziness and drowsiness. Avoid alcoholic drinks. If you are taking another medicine that also causes drowsiness, you may have more side effects. Give your health care provider a list of all medicines you use. Your doctor will tell you how much medicine to take. Do not take more medicine than directed. Call emergency for help if you have problems breathing or unusual sleepiness. What side effects may I notice from receiving this medicine? Side effects that you  should report to your doctor or health care professional as soon as possible: -allergic reactions like skin rash, itching or hives, swelling of the face, lips, or tongue -breathing problems -confusion -loss  of balance or coordination -signs and symptoms of low blood pressure like dizziness; feeling faint or lightheaded, falls; unusually weak or tired -suicidal thoughts or other mood changes Side effects that usually do not require medical attention (report to your doctor or health care professional if they continue or are bothersome): -dizziness -dry mouth -nausea, vomiting -tiredness This list may not describe all possible side effects. Call your doctor for medical advice about side effects. You may report side effects to FDA at 1-800-FDA-1088. Where should I keep my medicine? Keep out of the reach of children. This medicine can be abused. Keep your medicine in a safe place to protect it from theft. Do not share this medicine with anyone. Selling or giving away this medicine is dangerous and against the law. Store at room temperature between 20 and 25 degrees C (68 and 77 degrees F). This medicine may cause accidental overdose and death if taken by other adults, children, or pets. Mix any unused medicine with a substance like cat litter or coffee grounds. Then throw the medicine away in a sealed container like a sealed bag or a coffee can with a lid. Do not use the medicine after the expiration date. NOTE: This sheet is a summary. It may not cover all possible information. If you have questions about this medicine, talk to your doctor, pharmacist, or health care provider.  2017 Elsevier/Gold Standard (2015-05-16 13:47:25)   IF you received an x-ray today, you will receive an invoice from [facility]. Please contact [facility] at [phone] with questions or concerns regarding your invoice.   IF you received labwork today, you will receive an invoice from [facility]. Please contact [facility] at [phone] with questions or concerns regarding your invoice.   Our billing staff will not be able to assist you with questions regarding bills from these companies.  You will be contacted with the lab results as soon as they are available. The fastest way to get your results is to activate your My Chart account. Instructions are located on the last page of this paperwork. If you have not heard from us regarding the results in 2 weeks, please contact this office.

## 2016-07-22 ENCOUNTER — Other Ambulatory Visit: Payer: Self-pay | Admitting: Family Medicine

## 2016-07-22 LAB — COMPREHENSIVE METABOLIC PANEL
ALT: 15 U/L (ref 9–46)
AST: 14 U/L (ref 10–35)
Albumin: 4.5 g/dL (ref 3.6–5.1)
Alkaline Phosphatase: 98 U/L (ref 40–115)
BUN: 10 mg/dL (ref 7–25)
CALCIUM: 9.6 mg/dL (ref 8.6–10.3)
CO2: 28 mmol/L (ref 20–31)
Chloride: 107 mmol/L (ref 98–110)
Creat: 1 mg/dL (ref 0.70–1.33)
GLUCOSE: 89 mg/dL (ref 65–99)
POTASSIUM: 4.9 mmol/L (ref 3.5–5.3)
Sodium: 143 mmol/L (ref 135–146)
Total Bilirubin: 0.3 mg/dL (ref 0.2–1.2)
Total Protein: 7.8 g/dL (ref 6.1–8.1)

## 2016-07-22 LAB — CBC
HEMATOCRIT: 41.1 % (ref 38.5–50.0)
HEMOGLOBIN: 13.2 g/dL (ref 13.2–17.1)
MCH: 25.7 pg — ABNORMAL LOW (ref 27.0–33.0)
MCHC: 32.1 g/dL (ref 32.0–36.0)
MCV: 80 fL (ref 80.0–100.0)
MPV: 9.5 fL (ref 7.5–12.5)
Platelets: 376 10*3/uL (ref 140–400)
RBC: 5.14 MIL/uL (ref 4.20–5.80)
RDW: 15.6 % — ABNORMAL HIGH (ref 11.0–15.0)
WBC: 9.1 10*3/uL (ref 3.8–10.8)

## 2016-07-22 LAB — CK: Total CK: 231 U/L (ref 7–232)

## 2016-07-22 LAB — TSH: TSH: 0.97 mIU/L (ref 0.40–4.50)

## 2016-07-23 LAB — SEDIMENTATION RATE: Sed Rate: 5 mm/hr (ref 0–20)

## 2016-08-20 ENCOUNTER — Ambulatory Visit: Payer: 59 | Admitting: Urgent Care

## 2016-09-28 ENCOUNTER — Encounter: Payer: Self-pay | Admitting: Urgent Care

## 2016-10-07 ENCOUNTER — Other Ambulatory Visit: Payer: Self-pay | Admitting: *Deleted

## 2016-10-07 MED ORDER — SERTRALINE HCL 50 MG PO TABS: 50.0000 mg | ORAL_TABLET | Freq: Every day | ORAL | 0 refills | Status: DC

## 2016-10-28 ENCOUNTER — Ambulatory Visit (INDEPENDENT_AMBULATORY_CARE_PROVIDER_SITE_OTHER): Payer: BLUE CROSS/BLUE SHIELD

## 2016-10-28 ENCOUNTER — Ambulatory Visit (INDEPENDENT_AMBULATORY_CARE_PROVIDER_SITE_OTHER): Payer: BLUE CROSS/BLUE SHIELD | Admitting: Urgent Care

## 2016-10-28 VITALS — BP 108/60 | HR 87 | Temp 98.1°F | Resp 16 | Ht 72.75 in | Wt 249.6 lb

## 2016-10-28 DIAGNOSIS — M545 Low back pain, unspecified: Secondary | ICD-10-CM

## 2016-10-28 DIAGNOSIS — E860 Dehydration: Secondary | ICD-10-CM

## 2016-10-28 LAB — POCT URINALYSIS DIP (MANUAL ENTRY)
BILIRUBIN UA: NEGATIVE
Bilirubin, UA: NEGATIVE
Glucose, UA: NEGATIVE
Leukocytes, UA: NEGATIVE
Nitrite, UA: NEGATIVE
PH UA: 6
SPEC GRAV UA: 1.025
Urobilinogen, UA: 1

## 2016-10-28 MED ORDER — CYCLOBENZAPRINE HCL 5 MG PO TABS
5.0000 mg | ORAL_TABLET | Freq: Three times a day (TID) | ORAL | 1 refills | Status: DC | PRN
Start: 2016-10-28 — End: 2018-02-14

## 2016-10-28 NOTE — Patient Instructions (Addendum)
Make sure you hydrate very well with at least 2 liters (1 gallon) of water daily. You may use Tylenol 500mg  with or without ibuprofen 400-600mg  every 6 hours as needed for back pain.   Muscle Cramps and Spasms Muscle cramps and spasms occur when a muscle or muscles tighten and you have no control over this tightening (involuntary muscle contraction). They are a common problem and can develop in any muscle. The most common place is in the calf muscles of the leg. Muscle cramps and muscle spasms are both involuntary muscle contractions, but there are some differences between the two:  Muscle cramps are painful. They come and go and may last a few seconds to 15 minutes. Muscle cramps are often more forceful and last longer than muscle spasms.  Muscle spasms may or may not be painful. They may also last just a few seconds or much longer. Certain medical conditions, such as diabetes or Parkinson disease, can make it more likely to develop cramps or spasms. However, cramps or spasms are usually not caused by a serious underlying problem. Common causes include:  Overexertion.  Overuse from repetitive motions, or doing the same thing over and over.  Remaining in a certain position for a long period of time.  Improper preparation, form, or technique while playing a sport or doing an activity.  Dehydration.  Injury.  Side effects of some medicines.  Abnormally low levels of the salts and ions in your blood (electrolytes), especially potassium and calcium. This could happen if you are taking water pills (diuretics) or if you are pregnant. In many cases, the cause of muscle cramps or spasms is unknown. Follow these instructions at home:  Stay well hydrated. Drink enough fluid to keep your urine clear or pale yellow.  Try massaging, stretching, and relaxing the affected muscle.  If directed, apply heat to tight or tense muscles as often as told by your health care provider. Use the heat source  that your health care provider recommends, such as a moist heat pack or a heating pad.  Place a towel between your skin and the heat source.  Leave the heat on for 20-30 minutes.  Remove the heat if your skin turns bright red. This is especially important if you are unable to feel pain, heat, or cold. You may have a greater risk of getting burned.  If directed, put ice on the affected area. This may help if you are sore or have pain after a cramp or spasm.  Put ice in a plastic bag.  Place a towel between your skin and the bag.  Leavethe ice on for 20 minutes, 2-3 times a day.  Take over-the-counter and prescription medicines only as told by your health care provider.  Pay attention to any changes in your symptoms. Contact a health care provider if:  Your cramps or spasms get more severe or happen more often.  Your cramps or spasms do not improve over time. This information is not intended to replace advice given to you by your health care provider. Make sure you discuss any questions you have with your health care provider. Document Released: 02/06/2002 Document Revised: 09/18/2015 Document Reviewed: 05/21/2015 Elsevier Interactive Patient Education  2017 [employer].     IF you received an x-ray today, you will receive an invoice from [facility]. Please contact [facility] at [phone] with questions or concerns regarding your invoice.   IF you received labwork today, you will receive an invoice from [facility]. Please contact  [facility] at [phone] with questions or concerns regarding your invoice.   Our billing staff will not be able to assist you with questions regarding bills from these companies.  You will be contacted with the lab results as soon as they are available. The fastest way to get your results is to activate your My Chart account. Instructions are located on the last page of this paperwork. If you have not heard from us regarding the  results in 2 weeks, please contact this office.

## 2016-10-28 NOTE — Addendum Note (Signed)
Addended by: Wallis BambergMANI, Jaydan Chretien on: 10/28/2016 10:07 AM   Modules accepted: Orders

## 2016-10-28 NOTE — Progress Notes (Addendum)
MRN: [id number] DOB: 1965/12/27  Subjective:   Shawn Miller is a 51 y.o. male presenting for chief complaint of Back Pain (LOWER FOR AT LEAST 5 MONTHS)  Reports 5 month history of lower back pain, worse in the past 3 weeks. Reports that it is now constant, bilateral, worse with twisting, bending (can go to 9/10), is generally 5/10 for pain. Denies heavy lifting, numbness or tingling, falls, weakness, hematuria, dysuria, fever, n/v, abdominal pain, constipation, bloody stools, history of kidney stones, diabetes, kidney disease. Works doing switching of Pharmacist, hospital trailers. Denies heavy lifting. Has not tried medications for pain relief. Admits that he does not hydrate well. Drinks coffee and tea.  [name] has a current medication list which includes the following prescription(s): alprazolam and sertraline. Also has No Known Allergies.  Shawn Miller  has a past medical history of Anxiety. Also  has a past surgical history that includes Joint replacement (2003).  Objective:   Vitals: BP 108/60 (BP Location: Right Arm, Patient Position: Sitting, Cuff Size: Large)   Pulse 87   Temp 98.1 F (36.7 C) (Oral)   Resp 16   Ht 6' 0.75" (1.848 m)   Wt 249 lb 9.6 oz (113.2 kg)   SpO2 97%   BMI 33.16 kg/m   Physical Exam  Constitutional: He is oriented to person, place, and time. He appears well-developed and well-nourished.  Cardiovascular: Normal rate.   Pulmonary/Chest: Effort normal.  Abdominal: Soft. Bowel sounds are normal. He exhibits no distension and no mass. There is no tenderness. There is no guarding.  No CVA tenderness.  Musculoskeletal:       Cervical back: He exhibits normal range of motion, no tenderness, no bony tenderness, no swelling, no edema, no deformity and no spasm.       Thoracic back: He exhibits normal range of motion, no tenderness, no bony tenderness, no swelling, no edema, no deformity and no spasm.       Lumbar back: He exhibits normal range of motion (Negative SLR),  no tenderness, no bony tenderness, no swelling, no edema, no deformity and no spasm.  Neurological: He is alert and oriented to person, place, and time. He displays normal reflexes. Coordination normal.  Skin: Skin is warm and dry.  Psychiatric: He has a normal mood and affect.   Dg Lumbar Spine Complete  Result Date: 10/28/2016 CLINICAL DATA:  Low back pain for 5 months, no known injury EXAM: LUMBAR SPINE - COMPLETE 4+ VIEW COMPARISON:  01/11/2011 FINDINGS: 5 non-rib-bearing lumbar vertebra. Mild quantum mottling artifact on AP view. Osseous mineralization grossly normal. Small inferior endplate spur at L2. Vertebral body and disc space heights maintained. No acute fracture, subluxation, or bone destruction. No spondylolysis. SI joints preserved. IMPRESSION: No acute osseous abnormalities. Electronically Signed   By: Ulyses Southward M.D.   On: 10/28/2016 09:43   Results for orders placed or performed in visit on 10/28/16 (from the past 24 hour(s))  POCT urinalysis dipstick     Status: Abnormal   Collection Time: 10/28/16  9:47 AM  Result Value Ref Range   Color, UA yellow yellow   Clarity, UA clear clear   Glucose, UA negative negative   Bilirubin, UA negative negative   Ketones, POC UA negative negative   Spec Grav, UA 1.025    Blood, UA trace-intact (A) negative   pH, UA 6.0    Protein Ur, POC trace (A) negative   Urobilinogen, UA 1.0    Nitrite, UA Negative Negative   Leukocytes, UA Negative  Negative   Assessment and Plan :   1. Bilateral low back pain without sciatica, unspecified chronicity 2. Dehydration - Labs pending, will manage as back spasms likely worsened by lack of hydration. Recommended conservative management, use Flexeril as needed. RTC in 2 weeks if no improvement.  Shawn Curvin, PA-C [facility] [phone] 10/28/2016  9:07 AM

## 2016-10-29 LAB — BASIC METABOLIC PANEL
BUN/Creatinine Ratio: 12 (ref 9–20)
BUN: 12 mg/dL (ref 6–24)
CALCIUM: 9.7 mg/dL (ref 8.7–10.2)
CO2: 23 mmol/L (ref 18–29)
CREATININE: 1.02 mg/dL (ref 0.76–1.27)
Chloride: 102 mmol/L (ref 96–106)
GFR, EST AFRICAN AMERICAN: 97 mL/min/{1.73_m2} (ref >59)
GFR, EST NON AFRICAN AMERICAN: 84 mL/min/{1.73_m2} (ref >59)
Glucose: 108 mg/dL — ABNORMAL HIGH (ref 65–99)
Potassium: 4.2 mmol/L (ref 3.5–5.2)
Sodium: 145 mmol/L — ABNORMAL HIGH (ref 134–144)

## 2016-11-13 ENCOUNTER — Ambulatory Visit: Payer: BLUE CROSS/BLUE SHIELD

## 2017-08-07 ENCOUNTER — Ambulatory Visit: Payer: BLUE CROSS/BLUE SHIELD | Admitting: Physician Assistant

## 2017-08-19 ENCOUNTER — Encounter: Payer: Self-pay | Admitting: Family Medicine

## 2017-08-19 ENCOUNTER — Ambulatory Visit (INDEPENDENT_AMBULATORY_CARE_PROVIDER_SITE_OTHER): Payer: BLUE CROSS/BLUE SHIELD | Admitting: Family Medicine

## 2017-08-19 ENCOUNTER — Other Ambulatory Visit: Payer: Self-pay

## 2017-08-19 VITALS — BP 120/68 | HR 83 | Temp 97.9°F | Resp 18 | Ht 72.75 in | Wt 267.8 lb

## 2017-08-19 DIAGNOSIS — F411 Generalized anxiety disorder: Secondary | ICD-10-CM

## 2017-08-19 DIAGNOSIS — R202 Paresthesia of skin: Secondary | ICD-10-CM | POA: Diagnosis not present

## 2017-08-19 DIAGNOSIS — M545 Low back pain, unspecified: Secondary | ICD-10-CM

## 2017-08-19 DIAGNOSIS — K146 Glossodynia: Secondary | ICD-10-CM

## 2017-08-19 DIAGNOSIS — L299 Pruritus, unspecified: Secondary | ICD-10-CM

## 2017-08-19 MED ORDER — ALPRAZOLAM 0.5 MG PO TABS: 0.5000 mg | ORAL_TABLET | Freq: Every evening | ORAL | 0 refills | Status: DC | PRN

## 2017-08-19 NOTE — Progress Notes (Signed)
Subjective:  By signing my name below, I, Essence Howell, attest that this documentation has been prepared under the direction and in the presence of Shade Flood, MD Electronically Signed: Charline Bills, ED Scribe 08/19/2017 at 3:34 PM.   Patient ID: Shawn Miller, male    DOB: [birth year], 51 y.o.   MRN: [id number]  Chief Complaint  Patient presents with  . tongue pain    swelling and sore x month   . Ear Pain    right ear pain pt states it comes and goes    HPI Shawn Miller is a 51 y.o. male who presents to Primary Care at [facility] complaining of tongue pain and R ear pain.   Tongue Pain Pt reports intermittent tongue soreness upon waking in the morning x 2 months. States his tongue sticks to the roof of his mouth some mornings. He also states that he feels like his tongue may be swollen. Pt changes toothbrushes ~2 months ago, uses soft bristle and brushes twice daily. Does report some belching only after eating or drinking that resolves. No meds tried PTA but has tried freeze pops which seems to improve soreness. Denies trouble swallowing, choking, scratchy or itching throat. Denies heart burn; he was prescribed Nexium but never took it. States he cured heartburn with vinegar. He also noticed itching and tingling in his hands yesterday.  Ear Itching Pt reports bilateral ear itching x 6-7 yrs. He cleans his ears with a Q-tip once/week. Denies sneezing, eye itching, eye watering. Does take Zantac less than once/week.  Back Pain Pt reports back pain x 2 months that is exacerbated with ambulating and prolonged standing. Pt drives tractor trailers for a living. Denies fall, injury, bladder/bowel incontinence, weakness in LE.  Anxiety  At the end of the visit pt also requests a refill of Xanax. He takes Xanax for anxiety attacks which occurs 1-2 times/month. Takes it at bedtime to assist with sleep. Denies any side-effects, sedation behind the wheel after taking Xanax the night prior,  SI/HI, illicit drug use. Pt currently drinks a 6 pack on the weekends or 1/2 a pint on the weekend. His father has a h/o alcoholism.  Depression screen [facility] 2/9 08/19/2017 10/28/2016 07/21/2016 10/30/2015 08/12/2015  Decreased Interest 0 0 0 0 0  Down, Depressed, Hopeless 0 0 0 0 0  PHQ - 2 Score 0 0 0 0 0   There are no active problems to display for this patient.  Past Medical History:  Diagnosis Date  . Anxiety     No Known Allergies Prior to Admission medications   Medication Sig Start Date End Date Taking? Authorizing Provider  ALPRAZolam Prudy Feeler) 0.5 MG tablet Take 1 tablet (0.5 mg total) by mouth at bedtime as needed for anxiety. 07/21/16  Yes Wallis Bamberg, PA-C  cyclobenzaprine (FLEXERIL) 5 MG tablet Take 1 tablet (5 mg total) by mouth 3 (three) times daily as needed. Patient not taking: Reported on 08/19/2017 10/28/16   Wallis Bamberg, PA-C  sertraline (ZOLOFT) 50 MG tablet Take 1 tablet (50 mg total) by mouth daily. Patient not taking: Reported on 10/28/2016 10/07/16   Wallis Bamberg, PA-C   Social History   Socioeconomic History  . Marital status: Married    Spouse name: Not on file  . Number of children: Not on file  . Years of education: Not on file  . Highest education level: Not on file  Social Needs  . Financial resource strain: Not on file  . Food insecurity -  worry: Not on file  . Food insecurity - inability: Not on file  . Transportation needs - medical: Not on file  . Transportation needs - non-medical: Not on file  Occupational History  . Occupation: transportation  Tobacco Use  . Smoking status: Current Every Day Smoker    Packs/day: 0.50    Years: 33.00    Pack years: 16.50    Types: Cigarettes  . Smokeless tobacco: Never Used  Substance and Sexual Activity  . Alcohol use: Yes    Alcohol/week: 3.0 oz    Types: 5 Standard drinks or equivalent per week    Comment: liquor and beer  . Drug use: No  . Sexual activity: Not on file  Other Topics Concern  . Not on  file  Social History Narrative   Exercise walking 2 miles two times/week   Review of Systems  HENT: Positive for facial swelling (tongue). Negative for sneezing and trouble swallowing. Ear pain: +Ear itching.   Eyes: Negative for discharge and itching.  Respiratory: Negative for choking.   Genitourinary: Negative for enuresis.  Musculoskeletal: Positive for back pain.  Neurological: Positive for numbness (tingling). Negative for weakness.  Psychiatric/Behavioral: Negative for dysphoric mood and suicidal ideas. The patient is nervous/anxious.       Objective:   Physical Exam  Constitutional: He is oriented to person, place, and time. He appears well-developed and well-nourished. No distress.  HENT:  Head: Normocephalic and atraumatic.  Mouth/Throat: Mucous membranes are normal.  L TM: Small abrasion healing on posterior canal on the L. No active bleeding or edema.  R TM: Few tiny abrasion with dry skin at entrance of canal on the R. No active bleeding or edema. Mouth/Throat: I do not see any white patches on buccal mucosa or tongue. Some prominence of tongue but unsure of previous size. No apparent uvula swelling or posterior oropharynx swelling.  Eyes: Conjunctivae and EOM are normal.  Neck: Neck supple. No tracheal deviation present.  Cardiovascular: Normal rate.  Pulmonary/Chest: Effort normal. No respiratory distress.  Musculoskeletal: Normal range of motion.  No midline bony tenderness of lumbar spine. FROM. Able to heel and toe walk. Neg seated straight leg raise.  Neurological: He is alert and oriented to person, place, and time.  Skin: Skin is warm and dry.  Psychiatric: He has a normal mood and affect. His behavior is normal.  Nursing note and vitals reviewed.  Vitals:   08/19/17 1519  BP: 120/68  Pulse: 83  Resp: 18  Temp: 97.9 F (36.6 C)  TempSrc: Oral  SpO2: 97%  Weight: 267 lb 12.8 oz (121.5 kg)  Height: 6' 0.75" (1.848 m)      Assessment & Plan:     Shawn Miller is a 51 y.o. male Tongue pain - Plan: Vitamin B12, TSH Tingling in extremities - Plan: Vitamin B12, TSH  -Tongue symptoms appear to be around the time of changing toothbrushes as well as brushing his tongue. Reports some slight increase in size subjectively, but symptoms have been present for months. Doubt angioedema, BER/911 precautions were given, and no current ACE inhibitor or potential cause at this time.  - With dysesthesias in hands, will check TSH, B12, then follow-up to discuss symptoms further if no source found.  -ER/RTC precautions as above  Ear itching  -May have some component of dry skin/atopic dermatitis, but also may have some slight abrasion from using cotton-tipped swabs.  -avoid any further cotton tipped swab usage, Aveeno lotion on the fingertip if needed  for dry skin, RTC precautions.  Bilateral low back pain without sciatica, unspecified chronicity  - No red flags on exam or history. Likely mechanical. Symptomatic care discussed as below, with RTC precautions if persistent or worsening  Anxiety state - Plan: ALPRAZolam (XANAX) 0.5 MG tablet  -Overall stable with intermittent use of Xanax. Refilled, caution to not use when driving.  Meds ordered this encounter  Medications  . ALPRAZolam (XANAX) 0.5 MG tablet    Sig: Take 1 tablet (0.5 mg total) by mouth at bedtime as needed for anxiety.    Dispense:  30 tablet    Refill:  0   Patient Instructions    For ear itching, avoid cotton-tipped swabs or other objects inside the ear. If needed for itching on the outside part of the ear, you can apply a small amount of Aveeno lotion on the fingertip and apply to the outside of the canal only as we discussed. This can be used 2-3 times per day if needed. Recheck if the symptoms are not improving.  For tongue irritation, avoid brushing tongue with current toothbrushes that may be associated with discomfort. Avoid acidic foods, spicy or sour foods, very hot  or very cold fluids. Try Claritin once per day over-the-counter, and I will check some blood work. Follow-up in 2 weeks to recheck the symptoms, sooner if worse. As we discussed if any acute tongue swelling, trouble swallowing, or any breathing changes, call 911 or go to the emergency room.  Back pain appears to be in the muscles, see information below. Tylenol or occasional Advil over-the-counter if needed, range of motion and stretches during breaks. Recheck in 2 weeks.   Return to the clinic or go to the nearest emergency room if any of your symptoms worsen or new symptoms occur.  I refilled xanax for now, but if you require that more frequently, return to discuss other options.   Do not exceed 2 drinks per 24 hour period, let me know if you feel addicted to alcohol or difficulty cutting back.    Back Pain, Adult Many adults have back pain from time to time. Common causes of back pain include:  A strained muscle or ligament.  Wear and tear (degeneration) of the spinal disks.  Arthritis.  A hit to the back.  Back pain can be short-lived (acute) or last a long time (chronic). A physical exam, lab tests, and imaging studies may be done to find the cause of your pain. Follow these instructions at home: Managing pain and stiffness  Take over-the-counter and prescription medicines only as told by your health care provider.  If directed, apply heat to the affected area as often as told by your health care provider. Use the heat source that your health care provider recommends, such as a moist heat pack or a heating pad. ? Place a towel between your skin and the heat source. ? Leave the heat on for 20-30 minutes. ? Remove the heat if your skin turns bright red. This is especially important if you are unable to feel pain, heat, or cold. You have a greater risk of getting burned.  If directed, apply ice to the injured area: ? Put ice in a plastic bag. ? Place a towel between your skin and  the bag. ? Leave the ice on for 20 minutes, 2-3 times a day for the first 2-3 days. Activity  Do not stay in bed. Resting more than 1-2 days can delay your recovery.  Take short walks  on even surfaces as soon as you are able. Try to increase the length of time you walk each day.  Do not sit, drive, or stand in one place for more than 30 minutes at a time. Sitting or standing for long periods of time can put stress on your back.  Use proper lifting techniques. When you bend and lift, use positions that put less stress on your back: ? [place]end your knees. ? Keep the load close to your body. ? Avoid twisting.  Exercise regularly as told by your health care provider. Exercising will help your back heal faster. This also helps prevent back injuries by keeping muscles strong and flexible.  Your health care provider may recommend that you see a physical therapist. This person can help you come up with a safe exercise program. Do any exercises as told by your physical therapist. Lifestyle  Maintain a healthy weight. Extra weight puts stress on your back and makes it difficult to have good posture.  Avoid activities or situations that make you feel anxious or stressed. Learn ways to manage anxiety and stress. One way to manage stress is through exercise. Stress and anxiety increase muscle tension and can make back pain worse. General instructions  Sleep on a firm mattress in a comfortable position. Try lying on your side with your knees slightly bent. If you lie on your back, put a pillow under your knees.  Follow your treatment plan as told by your health care provider. This may include: ? Cognitive or behavioral therapy. ? Acupuncture or massage therapy. ? Meditation or yoga. Contact a health care provider if:  You have pain that is not relieved with rest or medicine.  You have increasing pain going down into your legs or buttocks.  Your pain does not improve in 2 weeks.  You have pain at  night.  You lose weight.  You have a fever or chills. Get help right away if:  You develop new bowel or bladder control problems.  You have unusual weakness or numbness in your arms or legs.  You develop nausea or vomiting.  You develop abdominal pain.  You feel faint. Summary  Many adults have back pain from time to time. A physical exam, lab tests, and imaging studies may be done to find the cause of your pain.  Use proper lifting techniques. When you bend and lift, use positions that put less stress on your back.  Take over-the-counter and prescription medicines and apply heat or ice as directed by your health care provider. This information is not intended to replace advice given to you by your health care provider. Make sure you discuss any questions you have with your health care provider. Document Released: 08/17/2005 Document Revised: 09/21/2016 Document Reviewed: 09/21/2016 Elsevier Interactive Patient Education  2018 [employer]    IF you received an x-ray today, you will receive an invoice from [facility]. Please contact [facility] at [phone] with questions or concerns regarding your invoice.   IF you received labwork today, you will receive an invoice from [facility]. Please contact [facility] at [phone] with questions or concerns regarding your invoice.   Our billing staff will not be able to assist you with questions regarding bills from these companies.  You will be contacted with the lab results as soon as they are available. The fastest way to get your results is to activate your My Chart account. Instructions are located on the last page of this paperwork. If you have not  heard from us regarding the results in 2 weeks, please contact this office.      I personally performed the services described in this documentation, which was scribed in my presence. The recorded information has been reviewed and considered for accuracy and  completeness, addended by me as needed, and agree with information above.  Signed,   Meredith StaggersJeffrey Momo Braun, MD [facility].  08/21/17 3:15 PM

## 2017-08-19 NOTE — Patient Instructions (Addendum)
For ear itching, avoid cotton-tipped swabs or other objects inside the ear. If needed for itching on the outside part of the ear, you can apply a small amount of Aveeno lotion on the fingertip and apply to the outside of the canal only as we discussed. This can be used 2-3 times per day if needed. Recheck if the symptoms are not improving.  For tongue irritation, avoid brushing tongue with current toothbrushes that may be associated with discomfort. Avoid acidic foods, spicy or sour foods, very hot or very cold fluids. Try Claritin once per day over-the-counter, and I will check some blood work. Follow-up in 2 weeks to recheck the symptoms, sooner if worse. As we discussed if any acute tongue swelling, trouble swallowing, or any breathing changes, call 911 or go to the emergency room.  Back pain appears to be in the muscles, see information below. Tylenol or occasional Advil over-the-counter if needed, range of motion and stretches during breaks. Recheck in 2 weeks.   Return to the clinic or go to the nearest emergency room if any of your symptoms worsen or new symptoms occur.  I refilled xanax for now, but if you require that more frequently, return to discuss other options.   Do not exceed 2 drinks per 24 hour period, let me know if you feel addicted to alcohol or difficulty cutting back.    Back Pain, Adult Many adults have back pain from time to time. Common causes of back pain include:  A strained muscle or ligament.  Wear and tear (degeneration) of the spinal disks.  Arthritis.  A hit to the back.  Back pain can be short-lived (acute) or last a long time (chronic). A physical exam, lab tests, and imaging studies may be done to find the cause of your pain. Follow these instructions at home: Managing pain and stiffness  Take over-the-counter and prescription medicines only as told by your health care provider.  If directed, apply heat to the affected area as often as told by your  health care provider. Use the heat source that your health care provider recommends, such as a moist heat pack or a heating pad. ? Place a towel between your skin and the heat source. ? Leave the heat on for 20-30 minutes. ? Remove the heat if your skin turns bright red. This is especially important if you are unable to feel pain, heat, or cold. You have a greater risk of getting burned.  If directed, apply ice to the injured area: ? Put ice in a plastic bag. ? Place a towel between your skin and the bag. ? Leave the ice on for 20 minutes, 2-3 times a day for the first 2-3 days. Activity  Do not stay in bed. Resting more than 1-2 days can delay your recovery.  Take short walks on even surfaces as soon as you are able. Try to increase the length of time you walk each day.  Do not sit, drive, or stand in one place for more than 30 minutes at a time. Sitting or standing for long periods of time can put stress on your back.  Use proper lifting techniques. When you bend and lift, use positions that put less stress on your back: ? [place]end your knees. ? Keep the load close to your body. ? Avoid twisting.  Exercise regularly as told by your health care provider. Exercising will help your back heal faster. This also helps prevent back injuries by keeping muscles strong and flexible.  Your health care provider may recommend that you see a physical therapist. This person can help you come up with a safe exercise program. Do any exercises as told by your physical therapist. Lifestyle  Maintain a healthy weight. Extra weight puts stress on your back and makes it difficult to have good posture.  Avoid activities or situations that make you feel anxious or stressed. Learn ways to manage anxiety and stress. One way to manage stress is through exercise. Stress and anxiety increase muscle tension and can make back pain worse. General instructions  Sleep on a firm mattress in a comfortable position. Try  lying on your side with your knees slightly bent. If you lie on your back, put a pillow under your knees.  Follow your treatment plan as told by your health care provider. This may include: ? Cognitive or behavioral therapy. ? Acupuncture or massage therapy. ? Meditation or yoga. Contact a health care provider if:  You have pain that is not relieved with rest or medicine.  You have increasing pain going down into your legs or buttocks.  Your pain does not improve in 2 weeks.  You have pain at night.  You lose weight.  You have a fever or chills. Get help right away if:  You develop new bowel or bladder control problems.  You have unusual weakness or numbness in your arms or legs.  You develop nausea or vomiting.  You develop abdominal pain.  You feel faint. Summary  Many adults have back pain from time to time. A physical exam, lab tests, and imaging studies may be done to find the cause of your pain.  Use proper lifting techniques. When you bend and lift, use positions that put less stress on your back.  Take over-the-counter and prescription medicines and apply heat or ice as directed by your health care provider. This information is not intended to replace advice given to you by your health care provider. Make sure you discuss any questions you have with your health care provider. Document Released: 08/17/2005 Document Revised: 09/21/2016 Document Reviewed: 09/21/2016 Elsevier Interactive Patient Education  2018 [employer]    IF you received an x-ray today, you will receive an invoice from [facility]. Please contact [facility] at [phone] with questions or concerns regarding your invoice.   IF you received labwork today, you will receive an invoice from [facility]. Please contact [facility] at [phone] with questions or concerns regarding your invoice.   Our billing staff will not be able to assist you with questions regarding bills from  these companies.  You will be contacted with the lab results as soon as they are available. The fastest way to get your results is to activate your My Chart account. Instructions are located on the last page of this paperwork. If you have not heard from us regarding the results in 2 weeks, please contact this office.

## 2017-08-20 LAB — TSH: TSH: 0.947 u[IU]/mL (ref 0.450–4.500)

## 2017-08-20 LAB — VITAMIN B12: Vitamin B-12: 430 pg/mL (ref 232–1245)

## 2017-09-02 ENCOUNTER — Ambulatory Visit: Payer: BLUE CROSS/BLUE SHIELD | Admitting: Urgent Care

## 2017-09-02 ENCOUNTER — Encounter: Payer: Self-pay | Admitting: Radiology

## 2017-09-06 ENCOUNTER — Encounter: Payer: Self-pay | Admitting: Family Medicine

## 2017-09-06 ENCOUNTER — Other Ambulatory Visit: Payer: Self-pay

## 2017-09-06 ENCOUNTER — Ambulatory Visit (INDEPENDENT_AMBULATORY_CARE_PROVIDER_SITE_OTHER): Payer: BLUE CROSS/BLUE SHIELD | Admitting: Family Medicine

## 2017-09-06 VITALS — BP 110/62 | HR 87 | Temp 97.9°F | Resp 18 | Ht 72.75 in | Wt 270.2 lb

## 2017-09-06 DIAGNOSIS — L299 Pruritus, unspecified: Secondary | ICD-10-CM | POA: Diagnosis not present

## 2017-09-06 DIAGNOSIS — K149 Disease of tongue, unspecified: Secondary | ICD-10-CM | POA: Diagnosis not present

## 2017-09-06 NOTE — Patient Instructions (Addendum)
Thanks for coming in today. Glad to hear your tongue and ear symptoms are improved. Asked yourr dentist next visit about other techniques for cleaning tongue as current toothbrush may be more irritating. Continue small amount of lotion to external ear canal if needed for itching or dry skin. Schedule physical and we can discuss cancer screening further at that time. With your family history of prostate cancer, I would recommend prostate cancer testing. Schedule follow-up with Dr. Loreta AveMann as planned.     IF you received an x-ray today, you will receive an invoice from [facility]. Please contact [facility] at [phone] with questions or concerns regarding your invoice.   IF you received labwork today, you will receive an invoice from [facility]. Please contact [facility] at [phone] with questions or concerns regarding your invoice.   Our billing staff will not be able to assist you with questions regarding bills from these companies.  You will be contacted with the lab results as soon as they are available. The fastest way to get your results is to activate your My Chart account. Instructions are located on the last page of this paperwork. If you have not heard from us regarding the results in 2 weeks, please contact this office.

## 2017-09-06 NOTE — Progress Notes (Signed)
Subjective:    Patient ID: Shawn Miller, male    DOB: Aug 14, 1966, 52 y.o.   MRN: [id number]  HPI Shawn Miller is a 52 y.o. male Presents today for: Chief Complaint  Patient presents with  . Ear Pain    follow up on ear and tongue pain    Here for follow-up of tongue symptoms and ear discomfort.  Tongue discomfort Seen 08/19/2017 with approximately 2 month history of tongue discomfort first thing in the morning. Noticed after changing toothbrushes, and was using a toothbrush to brush his tongue. Did not appear to be angioedema. Was having some dysesthesias in her hands, TSH and B12 were obtained which were normal.  Tongue sx's improved a few days after last ov. No longer brushing tongue.  Dysesthesias from last ov also improved. No tongue swelling, no pain or difficulty swallowing.   Results for orders placed or performed in visit on 08/19/17  Vitamin B12  Result Value Ref Range   Vitamin B-12 430 232 - 1,245 pg/mL  TSH  Result Value Ref Range   TSH 0.947 0.450 - 4.500 uIU/mL   Ear itching Noted for multiple years at last visit. Suspected some component of irritation with cotton tip swab usage as well as potential dry skin at external canal. Recommended avoidance of cotton-tipped swabs and if needed small amount of lotion on external canal. Improved with Aveeno, and no longer using cotton tipped swabs.   Health maintenance - asked about cancer screening at end of visit.  Last colonoscopy 2 years ago - Dr. Loreta Ave - plans on return this year - FH of colon CA and hx of polyps. Prostate cancer screening - father had prostate CA in 40-50's. Currently living at 54yo. No personal hx of testing.    There are no active problems to display for this patient.  Past Medical History:  Diagnosis Date  . Anxiety    Past Surgical History:  Procedure Laterality Date  . JOINT REPLACEMENT  2003   per patient neck-3 screws and 3 plate   No Known Allergies Prior to Admission medications     Medication Sig Start Date End Date Taking? Authorizing Provider  ALPRAZolam Prudy Feeler) 0.5 MG tablet Take 1 tablet (0.5 mg total) by mouth at bedtime as needed for anxiety. 08/19/17  Yes Shade Flood, MD  cyclobenzaprine (FLEXERIL) 5 MG tablet Take 1 tablet (5 mg total) by mouth 3 (three) times daily as needed. Patient not taking: Reported on 08/19/2017 10/28/16   Wallis Bamberg, PA-C   Social History   Socioeconomic History  . Marital status: Married    Spouse name: Not on file  . Number of children: Not on file  . Years of education: Not on file  . Highest education level: Not on file  Social Needs  . Financial resource strain: Not on file  . Food insecurity - worry: Not on file  . Food insecurity - inability: Not on file  . Transportation needs - medical: Not on file  . Transportation needs - non-medical: Not on file  Occupational History  . Occupation: transportation  Tobacco Use  . Smoking status: Current Every Day Smoker    Packs/day: 0.50    Years: 33.00    Pack years: 16.50    Types: Cigarettes  . Smokeless tobacco: Never Used  Substance and Sexual Activity  . Alcohol use: Yes    Alcohol/week: 3.0 oz    Types: 5 Standard drinks or equivalent per week    Comment:  liquor and beer  . Drug use: No  . Sexual activity: Not on file  Other Topics Concern  . Not on file  Social History Narrative   Exercise walking 2 miles two times/week    Review of Systems  Constitutional: Negative for fever.  HENT: Negative for ear discharge, ear pain, hearing loss, mouth sores, sore throat and trouble swallowing.   Respiratory: Negative for choking and shortness of breath.   Skin: Negative for rash.  Neurological: Negative for speech difficulty and numbness.       Objective:   Physical Exam  Constitutional: He is oriented to person, place, and time. He appears well-developed and well-nourished.  HENT:  Head: Normocephalic and atraumatic.  Right Ear: Tympanic membrane, external  ear and ear canal normal.  Left Ear: Tympanic membrane, external ear and ear canal normal.  Nose: No rhinorrhea.  Mouth/Throat: Oropharynx is clear and moist and mucous membranes are normal. No oropharyngeal exudate or posterior oropharyngeal erythema.  Min dry skin at external ear canal bilaterally.   Eyes: Conjunctivae are normal. Pupils are equal, round, and reactive to light.  Neck: Neck supple.  Cardiovascular: Normal rate, regular rhythm, normal heart sounds and intact distal pulses.  No murmur heard. Pulmonary/Chest: Effort normal and breath sounds normal. He has no wheezes. He has no rhonchi. He has no rales.  Abdominal: Soft. There is no tenderness.  Lymphadenopathy:    He has no cervical adenopathy.  Neurological: He is alert and oriented to person, place, and time.  Skin: Skin is warm and dry. No rash noted.  Psychiatric: He has a normal mood and affect. His behavior is normal.  Vitals reviewed.  Vitals:   09/06/17 0756  BP: 110/62  Pulse: 87  Resp: 18  Temp: 97.9 F (36.6 C)  TempSrc: Oral  SpO2: 98%  Weight: 270 lb 3.2 oz (122.6 kg)  Height: 6' 0.75" (1.848 m)          Assessment & Plan:  Shawn Miller is a 52 y.o. male Ear itching  - Improved, suspect component of dry skin as well as irritation from cotton-tipped swab usage previously. Continue lotion topical as needed, RTC precautions.  Tongue irritation  -Improved. Likely related to previous irritation from brushing. No sign of angioedema. RTC precautions if symptoms return.   Health maintenance, plans to return for physical as not fasting today. We did discuss pros and cons of prostate cancer testing, but would recommend that with his family history of prostate cancer, as well as follow-up with gastroenterology as planned for colon cancer screening.  No orders of the defined types were placed in this encounter.  Patient Instructions   Thanks for coming in today. Glad to hear your tongue and ear  symptoms are improved. Asked yourr dentist next visit about other techniques for cleaning tongue as current toothbrush may be more irritating. Continue small amount of lotion to external ear canal if needed for itching or dry skin. Schedule physical and we can discuss cancer screening further at that time. With your family history of prostate cancer, I would recommend prostate cancer testing. Schedule follow-up with Dr. Loreta Ave as planned.     IF you received an x-ray today, you will receive an invoice from [facility]. Please contact [facility] at [phone] with questions or concerns regarding your invoice.   IF you received labwork today, you will receive an invoice from [facility]. Please contact [facility] at [phone] with questions or concerns regarding your invoice.   Our billing  staff will not be able to assist you with questions regarding bills from these companies.  You will be contacted with the lab results as soon as they are available. The fastest way to get your results is to activate your My Chart account. Instructions are located on the last page of this paperwork. If you have not heard from us regarding the results in 2 weeks, please contact this office.      Signed,   Meredith StaggersJeffrey Katie Moch, MD Primary Care at [facility].  09/06/17 8:23 AM

## 2017-09-20 ENCOUNTER — Encounter: Payer: BLUE CROSS/BLUE SHIELD | Admitting: Family Medicine

## 2017-12-28 DIAGNOSIS — R079 Chest pain, unspecified: Secondary | ICD-10-CM | POA: Insufficient documentation

## 2017-12-28 DIAGNOSIS — Z720 Tobacco use: Secondary | ICD-10-CM | POA: Insufficient documentation

## 2018-02-09 ENCOUNTER — Ambulatory Visit: Payer: Self-pay
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
## 2018-02-14 ENCOUNTER — Encounter: Payer: Self-pay | Admitting: Physician Assistant

## 2018-02-14 ENCOUNTER — Ambulatory Visit: Payer: BLUE CROSS/BLUE SHIELD | Admitting: Physician Assistant

## 2018-02-14 ENCOUNTER — Other Ambulatory Visit: Payer: Self-pay

## 2018-02-14 VITALS — BP 119/87 | HR 85 | Temp 98.5°F | Ht 74.0 in | Wt 258.0 lb

## 2018-02-14 DIAGNOSIS — G4489 Other headache syndrome: Secondary | ICD-10-CM | POA: Diagnosis not present

## 2018-02-14 DIAGNOSIS — G4459 Other complicated headache syndrome: Secondary | ICD-10-CM | POA: Diagnosis not present
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
## 2018-02-14 NOTE — Patient Instructions (Signed)
     IF you received an x-ray today, you will receive an invoice from [facility]. Please contact [facility] at [phone] with questions or concerns regarding your invoice.   IF you received labwork today, you will receive an invoice from [facility]. Please contact [facility] at [phone] with questions or concerns regarding your invoice.   Our billing staff will not be able to assist you with questions regarding bills from these companies.  You will be contacted with the lab results as soon as they are available. The fastest way to get your results is to activate your My Chart account. Instructions are located on the last page of this paperwork. If you have not heard from us regarding the results in 2 weeks, please contact this office.     

## 2018-03-11 ENCOUNTER — Ambulatory Visit: Payer: Self-pay | Admitting: Urgent Care

## 2018-03-11 ENCOUNTER — Encounter (HOSPITAL_COMMUNITY): Payer: Self-pay | Admitting: *Deleted

## 2018-03-11 ENCOUNTER — Other Ambulatory Visit: Payer: Self-pay

## 2018-03-11 ENCOUNTER — Emergency Department (HOSPITAL_COMMUNITY): Payer: BLUE CROSS/BLUE SHIELD

## 2018-03-11 ENCOUNTER — Emergency Department (HOSPITAL_COMMUNITY)
Admission: EM | Admit: 2018-03-11 | Discharge: 2018-03-11 | Disposition: A | Discharge status: Home / Self Care | Payer: BLUE CROSS/BLUE SHIELD | Attending: Emergency Medicine | Admitting: Emergency Medicine

## 2018-03-11 DIAGNOSIS — F17210 Nicotine dependence, cigarettes, uncomplicated: Secondary | ICD-10-CM | POA: Diagnosis not present

## 2018-03-11 DIAGNOSIS — R0789 Other chest pain: Secondary | ICD-10-CM | POA: Diagnosis present

## 2018-03-11 DIAGNOSIS — R072 Precordial pain: Secondary | ICD-10-CM | POA: Insufficient documentation

## 2018-03-11 LAB — BASIC METABOLIC PANEL
ANION GAP: 9 (ref 5–15)
BUN: 13 mg/dL (ref 6–20)
CALCIUM: 9.6 mg/dL (ref 8.9–10.3)
CO2: 26 mmol/L (ref 22–32)
CREATININE: 1.1 mg/dL (ref 0.61–1.24)
Chloride: 107 mmol/L (ref 98–111)
GLUCOSE: 108 mg/dL — AB (ref 70–99)
Potassium: 3.8 mmol/L (ref 3.5–5.1)
Sodium: 142 mmol/L (ref 135–145)

## 2018-03-11 LAB — CBC
HCT: 41.7 % (ref 39.0–52.0)
Hemoglobin: 13 g/dL (ref 13.0–17.0)
MCH: 25.3 pg — AB (ref 26.0–34.0)
MCHC: 31.2 g/dL (ref 30.0–36.0)
MCV: 81.1 fL (ref 78.0–100.0)
PLATELETS: 366 10*3/uL (ref 150–400)
RBC: 5.14 MIL/uL (ref 4.22–5.81)
RDW: 16.6 % — ABNORMAL HIGH (ref 11.5–15.5)
WBC: 10.8 10*3/uL — ABNORMAL HIGH (ref 4.0–10.5)

## 2018-03-11 LAB — TROPONIN I

## 2018-03-11 NOTE — Telephone Encounter (Signed)
Pt c/o 1 day h/o chest pain to bilateral breast area. Pt stated the pain goes to his back Pt stated he is not having SOB. Pt stated pain comes and goes and later in the call stated it was constant. Pain is severe. Pt risk factors are overweight and a smoker. Pt stated he does not know what is causing the chest pain.. Only other sx is upper back pain. Pt stated he took a flexeril for it at 1 pm but the pain came back. Pt advised to pull over and call 911. Pt stated he was in [place] at the time of the call traveling back to [place]. Pt urged to go to the nearest ED. Pt stated [facility].  Reason for Disposition . SEVERE chest pain  Answer Assessment - Initial Assessment Questions 1. LOCATION: "Where does it hurt?"       Both sides of breast 2. RADIATION: "Does the pain go anywhere else?" (e.g., into neck, jaw, arms, back) back 3. ONSET: "When did the chest pain begin?" (Minutes, hours or days)      yesterday 4. PATTERN "Does the pain come and go, or has it been constant since it started?"  "Does it get worse with exertion?"  constant 5. DURATION: "How long does it last" (e.g., seconds, minutes, hours)     Pain is constant 6. SEVERITY: "How bad is the pain?"  (e.g., Scale 1-10; mild, moderate, or severe)    - MILD (1-3): doesn't interfere with normal activities     - MODERATE (4-7): interferes with normal activities or awakens from sleep    - SEVERE (8-10): excruciating pain, unable to do any normal activities   7-8 7. CARDIAC RISK FACTORS: "Do you have any history of heart problems or risk factors for heart disease?" (e.g., prior heart attack, angina; high blood pressure, diabetes, being overweight, high cholesterol, smoking, or strong family history of heart disease)   Overweight, smoker, 8. PULMONARY RISK FACTORS: "Do you have any history of lung disease?"  (e.g., blood clots in lung, asthma, emphysema, birth control pills)     no 9. CAUSE: "What do you  think is causing the chest pain?"     Pt does not know 10. OTHER SYMPTOMS: "Do you have any other symptoms?" (e.g., dizziness, nausea, vomiting, sweating, fever, difficulty breathing, cough)     Upper back pain 11. PREGNANCY: "Is there any chance you are pregnant?" "When was your last menstrual period?" n/a  Protocols used: CHEST PAIN-A-AH

## 2018-03-11 NOTE — ED Notes (Signed)
Pt departed in NAD, refused use of wheelchair.  
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
## 2018-03-12 NOTE — Telephone Encounter (Signed)
Pt seen in ED - FYI message to [facility]

## 2018-03-30 ENCOUNTER — Other Ambulatory Visit: Payer: Self-pay | Admitting: Urgent Care

## 2018-03-30 ENCOUNTER — Other Ambulatory Visit: Payer: Self-pay | Admitting: Family Medicine

## 2018-03-30 DIAGNOSIS — F411 Generalized anxiety disorder: Secondary | ICD-10-CM

## 2018-03-30 NOTE — Telephone Encounter (Signed)
Copied from CRM [id number]. Topic: Quick Communication - Rx Refill/Question >> Mar 30, 2018  1:14 PM Burchel, Abbi R wrote: Medication: ALPRAZolam Prudy Feeler(XANAX) 0.5 MG tablet  Preferred Pharmacy:[facility][id number] [place], [place] - [address] [facility] [address] [place] [id number] Phone: [phone] Fax: [phone]  Pt: [phone]  Pt was advised that RX refills may take up to 3 business days.

## 2018-03-30 NOTE — Telephone Encounter (Signed)
Xanax refill Last Refill:08/19/17 #30 Last OV: 02/14/18 PCP: Jordan LikesMani Mario,PA Pharmacy: [facility]  [address]

## 2018-03-31 NOTE — Telephone Encounter (Signed)
I only saw patient once in 2017 for anxiety.  I cannot refill his Xanax.  Please schedule patient for an office visit with any provider.

## 2018-04-09 ENCOUNTER — Ambulatory Visit: Payer: BLUE CROSS/BLUE SHIELD | Admitting: Urgent Care

## 2018-05-09 ENCOUNTER — Ambulatory Visit: Payer: BLUE CROSS/BLUE SHIELD | Admitting: Neurology

## 2018-05-09 ENCOUNTER — Telehealth: Payer: Self-pay | Admitting: Neurology

## 2018-05-09 ENCOUNTER — Encounter: Payer: Self-pay | Admitting: Neurology

## 2018-05-09 VITALS — BP 119/87 | HR 81 | Ht 74.0 in | Wt 263.0 lb

## 2018-05-09 DIAGNOSIS — R51 Headache with orthostatic component, not elsewhere classified: Secondary | ICD-10-CM

## 2018-05-09 DIAGNOSIS — G8929 Other chronic pain: Secondary | ICD-10-CM

## 2018-05-09 DIAGNOSIS — R519 Headache, unspecified: Secondary | ICD-10-CM

## 2018-05-09 MED ORDER — INDOMETHACIN ER 75 MG PO CPCR: 75.0000 mg | ORAL_CAPSULE | Freq: Every day | ORAL | 6 refills | Status: DC

## 2018-05-09 NOTE — Addendum Note (Signed)
Addended by: Naomie Dean B on: 05/09/2018 08:27 AM   Modules accepted: Orders

## 2018-05-09 NOTE — Patient Instructions (Addendum)
Indomethacin extended-release capsules What is this medicine? INDOMETHACIN (in doe Meth a sin) is a non-steroidal anti-inflammatory drug (NSAID). It is used to reduce swelling and to treat pain. It may be used for painful joint and muscular problems such as arthritis, tendinitis, bursitis, and gout. This medicine may be used for other purposes; ask your health care provider or pharmacist if you have questions. COMMON BRAND NAME(S): Indocin SR What should I tell my health care provider before I take this medicine? They need to know if you have any of these conditions: -asthma, especially aspirin sensitive asthma -coronary artery bypass graft (CABG) surgery within the past 2 weeks -depression -drink more than 3 alcohol containing drinks a day -heart disease or circulation problems like heart failure or leg edema (fluid retention) -high blood pressure -kidney disease -liver disease -Parkinson's disease -seizures -stomach bleeding or ulcers -an unusual or allergic reaction to indomethacin, aspirin, other NSAIDs, other medicines, foods, dyes, or preservatives -pregnant or trying to get pregnant -breast-feeding How should I use this medicine? Take this medicine by mouth with food and with a full glass of water. Do not crush or chew the medicine. Follow the directions on the prescription label. Take your medicine at regular intervals. Do not take your medicine more often than directed. Long-term, continuous use may increase the risk of heart attack or stroke. A special MedGuide will be given to you by the pharmacist with each prescription and refill. Be sure to read this information carefully each time. Talk to your pediatrician regarding the use of this medicine in children. Special care may be needed. While this drug may be prescribed for children as young as 15 years for selected conditions, precautions do apply. Elderly patients over 54 years old may have a stronger reaction and need a  smaller dose. Overdosage: If you think you have taken too much of this medicine contact a poison control center or emergency room at once. NOTE: This medicine is only for you. Do not share this medicine with others. What if I miss a dose? If you miss a dose, take it as soon as you can. If it is almost time for your next dose, take only that dose. Do not take double or extra doses. What may interact with this medicine? Do not take this medicine with any of the following medications: -cidofovir -diflunisal -ketorolac -methotrexate -pemetrexed -triamterene This medicine may also interact with the following medications: -alcohol -antacids -aspirin and aspirin like medicines -cyclosporine -digoxin -diuretics -lithium -medicines for diabetes -medicines for high blood pressure -medicines that affect platelets -medicines that treat or prevent blood clots like warfarin -NSAIDs, medicines for pain and inflammation, like ibuprofen or naproxen -probenecid -steroid medicines like prednisone or cortisone This list may not describe all possible interactions. Give your health care provider a list of all the medicines, herbs, non-prescription drugs, or dietary supplements you use. Also tell them if you smoke, drink alcohol, or use illegal drugs. Some items may interact with your medicine. What should I watch for while using this medicine? Tell your doctor or health care professional if your pain does not get better. Talk to your doctor before taking another medicine for pain. Do not treat yourself. This medicine does not prevent heart attack or stroke. In fact, this medicine may increase the chance of a heart attack or stroke. The chance may increase with longer use of this medicine and in people who have heart disease. If you take aspirin to prevent heart attack or stroke, talk with your  doctor or health care professional. Do not take medicines such as ibuprofen and naproxen with this medicine. Side  effects such as stomach upset, nausea, or ulcers may be more likely to occur. Many medicines available without a prescription should not be taken with this medicine. This medicine can cause ulcers and bleeding in the stomach and intestines at any time during treatment. Do not smoke cigarettes or drink alcohol. These increase irritation to your stomach and can make it more susceptible to damage from this medicine. Ulcers and bleeding can happen without warning symptoms and can cause death. You may get drowsy or dizzy. Do not drive, use machinery, or do anything that needs mental alertness until you know how this medicine affects you. Do not stand or sit up quickly, especially if you are an older patient. This reduces the risk of dizzy or fainting spells. This medicine can cause you to bleed more easily. Try to avoid damage to your teeth and gums when you brush or floss your teeth. What side effects may I notice from receiving this medicine? Side effects that you should report to your doctor or health care professional as soon as possible: -allergic reactions like skin rash, itching or hives, swelling of the face, lips, or tongue -difficulty breathing or wheezing -nausea, vomiting -signs and symptoms of bleeding such as bloody or black, tarry stools; red or dark-brown urine; spitting up blood or brown material that looks like coffee grounds; red spots on the skin; unusual bruising or bleeding from the eye, gums, or nose -signs and symptoms of a blood clot such as changes in vision; chest pain; severe, sudden headache; trouble speaking; sudden numbness or weakness of the face, arm, or leg; trouble walking -unexplained weight gain or swelling -unusually weak or tired -yellowing of eyes or skin Side effects that usually do not require medical attention (report to your doctor or health care professional if they continue or are bothersome): -diarrhea -dizziness -headache -heartburn This list may not  describe all possible side effects. Call your doctor for medical advice about side effects. You may report side effects to FDA at 1-800-FDA-1088. Where should I keep my medicine? Keep out of the reach of children. Store at room temperature between 15 and 30 degrees C (59 and 86 degrees F). Keep container tightly closed. Throw away any unused medicine after the expiration date. NOTE: This sheet is a summary. It may not cover all possible information. If you have questions about this medicine, talk to your doctor, pharmacist, or health care provider.  2018 Elsevier/Gold Standard (2013-01-03 16:31:09)   Carpal Tunnel Syndrome Carpal tunnel syndrome is a condition that causes pain in your hand and arm. The carpal tunnel is a narrow area that is on the palm side of your wrist. Repeated wrist motion or certain diseases may cause swelling in the tunnel. This swelling can pinch the main nerve in the wrist (median nerve). Follow these instructions at home: If you have a splint:  Wear it as told by your doctor. Remove it only as told by your doctor.  Loosen the splint if your fingers: ? Become numb and tingle. ? Turn blue and cold.  Keep the splint clean and dry. General instructions  Take over-the-counter and prescription medicines only as told by your doctor.  Rest your wrist from any activity that may be causing your pain. If needed, talk to your employer about changes that can be made in your work, such as getting a wrist pad to use while typing.  If directed, apply ice to the painful area: ? Put ice in a plastic bag. ? Place a towel between your skin and the bag. ? Leave the ice on for 20 minutes, 2-3 times per day.  Keep all follow-up visits as told by your doctor. This is important.  Do any exercises as told by your doctor, physical therapist, or occupational therapist. Contact a doctor if:  You have new symptoms.  Medicine does not help your pain.  Your symptoms get  worse. This information is not intended to replace advice given to you by your health care provider. Make sure you discuss any questions you have with your health care provider. Document Released: 08/06/2011 Document Revised: 01/23/2016 Document Reviewed: 01/02/2015 Elsevier Interactive Patient Education  [employer].
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
## 2018-05-09 NOTE — Telephone Encounter (Signed)
[employer] Auth: [id number] (exp. 05/09/18 to 06/07/18) order sent to GI. They will reach out to the pt to schedule.

## 2018-05-16 ENCOUNTER — Ambulatory Visit: Payer: Self-pay | Admitting: Urgent Care
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
## 2018-05-21 ENCOUNTER — Ambulatory Visit
Admission: RE | Admit: 2018-05-21 | Discharge: 2018-05-21 | Disposition: A | Discharge status: Home / Self Care | Payer: BLUE CROSS/BLUE SHIELD | Source: Ambulatory Visit | Attending: Neurology | Admitting: Neurology

## 2018-05-21 DIAGNOSIS — R519 Headache, unspecified: Secondary | ICD-10-CM

## 2018-05-21 DIAGNOSIS — G8929 Other chronic pain: Secondary | ICD-10-CM

## 2018-05-21 DIAGNOSIS — R51 Headache with orthostatic component, not elsewhere classified: Secondary | ICD-10-CM

## 2018-05-27 ENCOUNTER — Other Ambulatory Visit: Payer: Self-pay | Admitting: Neurology

## 2018-05-27 ENCOUNTER — Telehealth: Payer: Self-pay | Admitting: Neurology

## 2018-05-27 ENCOUNTER — Other Ambulatory Visit: Payer: Self-pay

## 2018-05-27 ENCOUNTER — Emergency Department (HOSPITAL_COMMUNITY)
Admission: EM | Admit: 2018-05-27 | Discharge: 2018-05-28 | Disposition: A | Discharge status: Home / Self Care | Payer: BLUE CROSS/BLUE SHIELD | Attending: Emergency Medicine | Admitting: Emergency Medicine

## 2018-05-27 ENCOUNTER — Emergency Department (HOSPITAL_COMMUNITY): Payer: BLUE CROSS/BLUE SHIELD

## 2018-05-27 ENCOUNTER — Encounter (HOSPITAL_COMMUNITY): Payer: Self-pay | Admitting: Emergency Medicine

## 2018-05-27 DIAGNOSIS — M62830 Muscle spasm of back: Secondary | ICD-10-CM | POA: Diagnosis not present

## 2018-05-27 DIAGNOSIS — M549 Dorsalgia, unspecified: Secondary | ICD-10-CM | POA: Diagnosis present

## 2018-05-27 DIAGNOSIS — F17210 Nicotine dependence, cigarettes, uncomplicated: Secondary | ICD-10-CM | POA: Insufficient documentation

## 2018-05-27 MED ORDER — ALPRAZOLAM 0.25 MG PO TABS: ORAL_TABLET | ORAL | 0 refills | Status: DC

## 2018-05-27 MED ORDER — CYCLOBENZAPRINE HCL 10 MG PO TABS
5.0000 mg | ORAL_TABLET | Freq: Once | ORAL | Status: AC
  Administered 2018-05-28: 5 mg via ORAL
  Filled 2018-05-27: qty 1

## 2018-05-27 MED ORDER — OXYCODONE-ACETAMINOPHEN 5-325 MG PO TABS: 2.0000 | ORAL_TABLET | Freq: Once | ORAL | Status: DC

## 2018-05-27 MED ORDER — OXYCODONE-ACETAMINOPHEN 5-325 MG PO TABS
1.0000 | ORAL_TABLET | Freq: Once | ORAL | Status: AC
  Administered 2018-05-28: 1 via ORAL
  Filled 2018-05-27: qty 1

## 2018-05-27 NOTE — ED Notes (Signed)
No orders at this time per [place], [place].
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
## 2018-05-27 NOTE — Telephone Encounter (Signed)
completed

## 2018-05-27 NOTE — Telephone Encounter (Signed)
Pt has called to inform that his MRI has been r/s to this coming Sunday @ 4pm.  Pt states that he is claustrophobic.  Pt is asking something be called into  [id number] [place], [place] - [address] AT [facility] OF [facility] & [address] [phone] (Phone) [phone] (Fax)

## 2018-05-27 NOTE — ED Triage Notes (Signed)
Pt states he was driving 45 min ago and sneezed.  States he had sudden onset of pain in his L lower back, L side, and L abd.

## 2018-05-27 NOTE — Telephone Encounter (Signed)
Pt states when he last saw Dr Lucia Gaskins she had mentioned a series of injections she would suggest for pt's head aches.  Pt states his head aches have become more and more frequent so he would very much like to now move forward in moving forward with injections.  Pt is asking for a call to discuss

## 2018-05-27 NOTE — Telephone Encounter (Signed)
Will discuss with Dr. Lucia Gaskins.

## 2018-05-28 MED ORDER — TRAMADOL HCL 50 MG PO TABS: 50.0000 mg | ORAL_TABLET | Freq: Four times a day (QID) | ORAL | 0 refills | Status: DC | PRN

## 2018-05-28 MED ORDER — MELOXICAM 15 MG PO TABS: 15.0000 mg | ORAL_TABLET | Freq: Every day | ORAL | 0 refills | Status: DC

## 2018-05-28 MED ORDER — CYCLOBENZAPRINE HCL 10 MG PO TABS: 5.0000 mg | ORAL_TABLET | Freq: Two times a day (BID) | ORAL | 0 refills | Status: DC | PRN

## 2018-05-28 NOTE — Discharge Instructions (Signed)
SEEK IMMEDIATE MEDICAL ATTENTION IF: New numbness, tingling, weakness, or problem with the use of your arms or legs.  Severe back pain not relieved with medications.  Change in bowel or bladder control.  Increasing pain in any areas of the body (such as chest or abdominal pain).  Shortness of breath, dizziness or fainting.  Nausea (feeling sick to your stomach), vomiting, fever, or sweats.  

## 2018-05-29 ENCOUNTER — Ambulatory Visit
Admission: RE | Admit: 2018-05-29 | Discharge: 2018-05-29 | Disposition: A | Discharge status: Home / Self Care | Payer: BLUE CROSS/BLUE SHIELD | Source: Ambulatory Visit | Attending: Neurology | Admitting: Neurology

## 2018-05-29 DIAGNOSIS — R51 Headache: Secondary | ICD-10-CM

## 2018-05-29 MED ORDER — GADOBENATE DIMEGLUMINE 529 MG/ML IV SOLN
20.0000 mL | Freq: Once | INTRAVENOUS | Status: AC | PRN
  Administered 2018-05-29: 20 mL via INTRAVENOUS

## 2018-05-30 NOTE — Telephone Encounter (Signed)
We discussed nerve blocks if he is interested we can schedule appointment thanks

## 2018-05-30 NOTE — Telephone Encounter (Signed)
Spoke with patient and discussed nerve blocks and possible side effects. He stated that his MRI brain was done yesterday. He would like for Dr. Lucia Gaskins to get the results first before setting up an appointment for the nerve blocks however he is interested. Pt advised that the scan has not been read yet however I would send a message to Dr. Lucia Gaskins so she is aware. He verbalized appreciation.

## 2018-05-30 NOTE — Telephone Encounter (Signed)
MRI brain normal. If he would like nerve blocks please schedule him thanks

## 2018-05-31 NOTE — Telephone Encounter (Signed)
Called pt and LVM (ok per DPR) informing him that Dr. Lucia Gaskins said his MRI brain is normal and if he would like the nerve blocks we can get him on the schedule. Left office number and asked for a call back to schedule.

## 2019-01-10 ENCOUNTER — Encounter: Payer: Self-pay | Admitting: Family Medicine

## 2019-01-10 ENCOUNTER — Ambulatory Visit: Payer: 59 | Admitting: Family Medicine

## 2019-01-10 ENCOUNTER — Other Ambulatory Visit: Payer: Self-pay

## 2019-01-10 VITALS — BP 149/81 | HR 59 | Temp 98.4°F | Resp 18 | Ht 74.0 in | Wt 266.2 lb

## 2019-01-10 DIAGNOSIS — R21 Rash and other nonspecific skin eruption: Secondary | ICD-10-CM | POA: Diagnosis not present

## 2019-01-10 NOTE — Progress Notes (Signed)
Acute Office Visit  Subjective:    Patient ID: Shawn Miller, male    DOB: 1966-06-29, 53 y.o.   MRN: [id number]  Chief Complaint  Patient presents with  . Rash    both hands looks like bites x2weeks ago.     HPI Patient is in today for rash noted on the hands bilat Pt states left hand-dorsal area with several raised lesion that pt picked and now have healed. No pain, no itch. Pt states he noted areas after wearing gloves-non latex. Pt states he put on gloves this morning from a different box and a lesion appeared on the right hand-dorsal area. Pt states no itch, no pain. Pt did not see an insect. No prior h/o eczema.  Past Medical History:  Diagnosis Date  . Anxiety   . Headache     Past Surgical History:  Procedure Laterality Date  . JOINT REPLACEMENT  2003   per patient neck-3 screws and 3 plate  . posterior neck surgery      Family History  Problem Relation Age of Onset  . Cancer Mother        breast  . Dementia Father   . Heart disease Father   . Cancer Sister        thyroid  . Headache Neg Hx     Social History   Socioeconomic History  . Marital status: Married    Spouse name: Not on file  . Number of children: Not on file  . Years of education: Not on file  . Highest education level: Not on file  Occupational History  . Occupation: transportation  Social Needs  . Financial resource strain: Not on file  . Food insecurity:    Worry: Not on file    Inability: Not on file  . Transportation needs:    Medical: Not on file    Non-medical: Not on file  Tobacco Use  . Smoking status: Current Every Day Smoker    Packs/day: 1.00    Years: 33.00    Pack years: 33.00    Types: Cigarettes  . Smokeless tobacco: Never Used  Substance and Sexual Activity  . Alcohol use: Yes  . Drug use: No  . Sexual activity: Not on file  Lifestyle  . Physical activity:    Days per week: Not on file    Minutes per session: Not on file  . Stress: Not on file   Relationships  . Social connections:    Talks on phone: Not on file    Gets together: Not on file    Attends religious service: Not on file    Active member of club or organization: Not on file    Attends meetings of clubs or organizations: Not on file    Relationship status: Not on file  . Intimate partner violence:    Fear of current or ex partner: Not on file    Emotionally abused: Not on file    Physically abused: Not on file    Forced sexual activity: Not on file  Other Topics Concern  . Not on file  Social History Narrative   Exercise walking 2 miles two times/week    Outpatient Medications Prior to Visit  Medication Sig Dispense Refill  . ALPRAZolam (XANAX) 0.25 MG tablet Take 1-2 tabs (0.25mg -0.50mg ) 30-60 minutes before procedure. May repeat if needed.Do not drive. 4 tablet 0  . cyclobenzaprine (FLEXERIL) 10 MG tablet Take 0.5-1 tablets (5-10 mg total) by mouth 2 (two) times  daily as needed for muscle spasms. (Patient not taking: Reported on 01/10/2019) 20 tablet 0  . indomethacin (INDOCIN SR) 75 MG CR capsule Take 1 capsule (75 mg total) by mouth daily with breakfast. (Patient not taking: Reported on 01/10/2019) 14 capsule 6  . meloxicam (MOBIC) 15 MG tablet Take 1 tablet (15 mg total) by mouth daily. Take 1 daily with food. (Patient not taking: Reported on 01/10/2019) 10 tablet 0  . traMADol (ULTRAM) 50 MG tablet Take 1 tablet (50 mg total) by mouth every 6 (six) hours as needed. (Patient not taking: Reported on 01/10/2019) 15 tablet 0   No facility-administered medications prior to visit.     Allergies  Allergen Reactions  . Gadolinium Derivatives Cough    Dr. Mayford Knife s/w patient after patient  began coughing post injection. Patient unsure if he just got choked or if it was the contrast. No treatment expect for 20 minute observation. ry    ROS CONSTITUTIONAL: no  Fever INTEG: persistent rash, NO itching, new skin lesion x 1 right hand, change in existing  lesions-hyperpigmented prior lesions-left hand      Objective:    Physical Exam  Constitutional: He appears well-developed and well-nourished. No distress.  Skin: Rash noted. There is erythema.  right dorsum-hand-53mm lesion -associated eryth, no drainage, no vesicle, left dorsum hand-hyperpigmented macules x 3 . BP (!) 149/81   Pulse (!) 59   Temp 98.4 F (36.9 C) (Oral)   Resp 18   Ht 6\' 2"  (1.88 m)   Wt 266 lb 3.2 oz (120.7 kg)   SpO2 96%   BMI 34.18 kg/m  Wt Readings from Last 3 Encounters:  01/10/19 266 lb 3.2 oz (120.7 kg)  05/27/18 253 lb (114.8 kg)  05/09/18 263 lb (119.3 kg)    Health Maintenance Due  Topic Date Due  . Hepatitis C Screening  02/23/1964  . HIV Screening  12/29/1978  . TETANUS/TDAP  12/29/1982  . COLONOSCOPY  12/28/2013    There are no preventive care reminders to display for this patient.   Lab Results  Component Value Date   TSH 0.947 08/19/2017   Lab Results  Component Value Date   WBC 10.8 (H) 03/11/2018   HGB 13.0 03/11/2018   HCT 41.7 03/11/2018   MCV 81.1 03/11/2018   PLT 366 03/11/2018   Lab Results  Component Value Date   NA 142 03/11/2018   K 3.8 03/11/2018   CO2 26 03/11/2018   GLUCOSE 108 (H) 03/11/2018   BUN 13 03/11/2018   CREATININE 1.10 03/11/2018   BILITOT 0.3 07/21/2016   ALKPHOS 98 07/21/2016   AST 14 07/21/2016   ALT 15 07/21/2016   PROT 7.8 07/21/2016   ALBUMIN 4.5 07/21/2016   CALCIUM 9.6 03/11/2018   ANIONGAP 9 03/11/2018   Lab Results  Component Value Date   CHOL 206 (H) 10/30/2015   Lab Results  Component Value Date   HDL 38 (L) 10/30/2015   Lab Results  Component Value Date   LDLCALC 142 (H) 10/30/2015   Lab Results  Component Value Date   TRIG 131 10/30/2015   Lab Results  Component Value Date   CHOLHDL 5.4 (H) 10/30/2015      Assessment & Plan:   1. Rash and nonspecific skin eruption Hydrocortisone cream-rx otc apply to left hand, antibiotic ointment to right lesion with  bandage -avoid picking and use of hand in dirty water until healed. Use work or garden Museum/gallery conservator for grocery shopping  to avoid COVID contact.  Use cream at night and cotton socks on hands to avoid picking   Corneilus Heggie Mat Carne, MD

## 2019-01-10 NOTE — Patient Instructions (Signed)
° ° ° °  If you have lab work done today you will be contacted with your lab results within the next 2 weeks.  If you have not heard from us then please contact us. The fastest way to get your results is to register for My Chart. ° ° °IF you received an x-ray today, you will receive an invoice from [facility]. Please contact [facility] at [phone] with questions or concerns regarding your invoice.  ° °IF you received labwork today, you will receive an invoice from [facility]. Please contact [facility] at [phone] with questions or concerns regarding your invoice.  ° °Our billing staff will not be able to assist you with questions regarding bills from these companies. ° °You will be contacted with the lab results as soon as they are available. The fastest way to get your results is to activate your My Chart account. Instructions are located on the last page of this paperwork. If you have not heard from us regarding the results in 2 weeks, please contact this office. °  ° ° ° °

## 2019-01-31 ENCOUNTER — Other Ambulatory Visit: Payer: Self-pay

## 2019-01-31 ENCOUNTER — Encounter: Payer: Self-pay | Admitting: Family Medicine

## 2019-01-31 ENCOUNTER — Ambulatory Visit: Payer: 59 | Admitting: Family Medicine

## 2019-01-31 VITALS — BP 128/75 | HR 85 | Temp 97.8°F | Resp 14 | Ht 74.0 in | Wt 260.0 lb

## 2019-01-31 DIAGNOSIS — Z23 Encounter for immunization: Secondary | ICD-10-CM | POA: Diagnosis not present

## 2019-01-31 DIAGNOSIS — Z125 Encounter for screening for malignant neoplasm of prostate: Secondary | ICD-10-CM

## 2019-01-31 DIAGNOSIS — Z131 Encounter for screening for diabetes mellitus: Secondary | ICD-10-CM | POA: Diagnosis not present

## 2019-01-31 DIAGNOSIS — Z0001 Encounter for general adult medical examination with abnormal findings: Secondary | ICD-10-CM | POA: Diagnosis not present

## 2019-01-31 DIAGNOSIS — Z1159 Encounter for screening for other viral diseases: Secondary | ICD-10-CM

## 2019-01-31 DIAGNOSIS — Z1329 Encounter for screening for other suspected endocrine disorder: Secondary | ICD-10-CM

## 2019-01-31 DIAGNOSIS — N529 Male erectile dysfunction, unspecified: Secondary | ICD-10-CM | POA: Diagnosis not present

## 2019-01-31 DIAGNOSIS — Z0000 Encounter for general adult medical examination without abnormal findings: Secondary | ICD-10-CM

## 2019-01-31 DIAGNOSIS — Z114 Encounter for screening for human immunodeficiency virus [HIV]: Secondary | ICD-10-CM

## 2019-01-31 DIAGNOSIS — Z1211 Encounter for screening for malignant neoplasm of colon: Secondary | ICD-10-CM

## 2019-01-31 DIAGNOSIS — E785 Hyperlipidemia, unspecified: Secondary | ICD-10-CM | POA: Diagnosis not present

## 2019-01-31 LAB — LIPID PANEL

## 2019-01-31 MED ORDER — SILDENAFIL CITRATE 50 MG PO TABS: 25.0000 mg | ORAL_TABLET | Freq: Every day | ORAL | 3 refills | Status: DC | PRN

## 2019-01-31 MED ORDER — ZOSTER VAC RECOMB ADJUVANTED 50 MCG/0.5ML IM SUSR: 0.5000 mL | Freq: Once | INTRAMUSCULAR | 1 refills | Status: AC

## 2019-01-31 NOTE — Patient Instructions (Addendum)
I will check testosterone level today, but as we discussed that needs to be verified if low with at least 1 or 2 more readings between 8 and 10 in the morning.  Viagra at lowest effective dose if needed.   I will check some screening tests today, and let you know if any concerns.  Follow-up in 1 month review labs from today as well as discuss effectiveness of Viagra.  Return to the clinic or go to the nearest emergency room if any of your symptoms worsen or new symptoms occur.  Keeping you healthy  Get these tests  Blood pressure- Have your blood pressure checked once a year by your healthcare provider.  Normal blood pressure is 120/80  Weight- Have your body mass index (BMI) calculated to screen for obesity.  BMI is a measure of body fat based on height and weight. You can also calculate your own BMI at [email or web address].  Cholesterol- Have your cholesterol checked every year.  Diabetes- Have your blood sugar checked regularly if you have high blood pressure, high cholesterol, have a family history of diabetes or if you are overweight.  Screening for Colon Cancer- Colonoscopy starting at age 55.  Screening may begin sooner depending on your family history and other health conditions. Follow up colonoscopy as directed by your Gastroenterologist.  Screening for Prostate Cancer- Both blood work (PSA) and a rectal exam help screen for Prostate Cancer.  Screening begins at age 79 with African-American men and at age 33 with Caucasian men.  Screening may begin sooner depending on your family history.  Take these medicines  Aspirin- One aspirin daily can help prevent Heart disease and Stroke.  Flu shot- Every fall.  Tetanus- Every 10 years.  Zostavax- Once after the age of 38 to prevent Shingles.  Pneumonia shot- Once after the age of 40; if you are younger than 57, ask your healthcare provider if you need a Pneumonia shot.  Take these steps  Don't smoke- If you do smoke,  talk to your doctor about quitting.  For tips on how to quit, go to www.smokefree.gov or call 1-800-QUIT-NOW.  Be physically active- Exercise 5 days a week for at least 30 minutes.  If you are not already physically active start slow and gradually work up to 30 minutes of moderate physical activity.  Examples of moderate activity include walking briskly, mowing the yard, dancing, swimming, bicycling, etc.  Eat a healthy diet- Eat a variety of healthy food such as fruits, vegetables, low fat milk, low fat cheese, yogurt, lean meant, poultry, fish, beans, tofu, etc. For more information go to www.thenutritionsource.org  Drink alcohol in moderation- Limit alcohol intake to less than two drinks a day. Never drink and drive.  Dentist- Brush and floss twice daily; visit your dentist twice a year.  Depression- Your emotional health is as important as your physical health. If you're feeling down, or losing interest in things you would normally enjoy please talk to your healthcare provider.  Eye exam- Visit your eye doctor every year.  Safe sex- If you may be exposed to a sexually transmitted infection, use a condom.  Seat belts- Seat belts can save your life; always wear one.  Smoke/Carbon Monoxide detectors- These detectors need to be installed on the appropriate level of your home.  Replace batteries at least once a year.  Skin cancer- When out in the sun, cover up and use sunscreen 15 SPF or higher.  Violence- If anyone is threatening you, please tell your healthcare  provider.  Living Will/ Health care power of attorney- Speak with your healthcare provider and family.  If you have lab work done today you will be contacted with your lab results within the next 2 weeks.  If you have not heard from us then please contact us. The fastest way to get your results is to register for My Chart.   IF you received an x-ray today, you will receive an invoice from [facility]. Please contact  [facility] at [phone] with questions or concerns regarding your invoice.   IF you received labwork today, you will receive an invoice from [facility]. Please contact [facility] at [phone] with questions or concerns regarding your invoice.   Our billing staff will not be able to assist you with questions regarding bills from these companies.  You will be contacted with the lab results as soon as they are available. The fastest way to get your results is to activate your My Chart account. Instructions are located on the last page of this paperwork. If you have not heard from us regarding the results in 2 weeks, please contact this office.
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
## 2019-02-01 LAB — TSH: TSH: 0.784 u[IU]/mL (ref 0.450–4.500)

## 2019-02-01 LAB — LIPID PANEL
Chol/HDL Ratio: 5 ratio (ref 0.0–5.0)
Cholesterol, Total: 215 mg/dL — ABNORMAL HIGH (ref 100–199)
HDL: 43 mg/dL (ref >39)
LDL Calculated: 140 mg/dL — ABNORMAL HIGH (ref 0–99)
Triglycerides: 162 mg/dL — ABNORMAL HIGH (ref 0–149)
VLDL Cholesterol Cal: 32 mg/dL (ref 5–40)

## 2019-02-01 LAB — TESTOSTERONE, FREE, TOTAL, SHBG
Sex Hormone Binding: 19.8 nmol/L (ref 19.3–76.4)
Testosterone, Free: 5.8 pg/mL — ABNORMAL LOW (ref 7.2–24.0)
Testosterone: 264 ng/dL (ref 264–916)

## 2019-02-01 LAB — HEPATITIS C ANTIBODY: Hep C Virus Ab: <0.1 s/co ratio (ref 0.0–0.9)

## 2019-02-01 LAB — COMPREHENSIVE METABOLIC PANEL
ALT: 28 IU/L (ref 0–44)
AST: 19 IU/L (ref 0–40)
Albumin/Globulin Ratio: 1.8 (ref 1.2–2.2)
Albumin: 4.9 g/dL (ref 3.8–4.9)
Alkaline Phosphatase: 102 IU/L (ref 39–117)
BUN/Creatinine Ratio: 11 (ref 9–20)
BUN: 11 mg/dL (ref 6–24)
Bilirubin Total: 0.4 mg/dL (ref 0.0–1.2)
CO2: 23 mmol/L (ref 20–29)
Calcium: 10.2 mg/dL (ref 8.7–10.2)
Chloride: 102 mmol/L (ref 96–106)
Creatinine, Ser: 0.97 mg/dL (ref 0.76–1.27)
GFR calc Af Amer: 101 mL/min/{1.73_m2} (ref >59)
GFR calc non Af Amer: 88 mL/min/{1.73_m2} (ref >59)
Globulin, Total: 2.7 g/dL (ref 1.5–4.5)
Glucose: 88 mg/dL (ref 65–99)
Potassium: 4.8 mmol/L (ref 3.5–5.2)
Sodium: 142 mmol/L (ref 134–144)
Total Protein: 7.6 g/dL (ref 6.0–8.5)

## 2019-02-01 LAB — HIV ANTIBODY (ROUTINE TESTING W REFLEX): HIV Screen 4th Generation wRfx: NONREACTIVE

## 2019-02-01 LAB — PSA: Prostate Specific Ag, Serum: 0.7 ng/mL (ref 0.0–4.0)

## 2019-02-05 ENCOUNTER — Other Ambulatory Visit: Payer: Self-pay | Admitting: Family Medicine

## 2019-02-05 DIAGNOSIS — R7989 Other specified abnormal findings of blood chemistry: Secondary | ICD-10-CM

## 2019-03-10 ENCOUNTER — Other Ambulatory Visit: Payer: Self-pay

## 2019-03-10 ENCOUNTER — Ambulatory Visit: Payer: 59 | Admitting: Family Medicine

## 2019-03-10 VITALS — BP 133/80 | HR 84 | Temp 97.6°F | Resp 18 | Ht 72.0 in | Wt 265.8 lb

## 2019-03-10 DIAGNOSIS — N529 Male erectile dysfunction, unspecified: Secondary | ICD-10-CM

## 2019-03-10 DIAGNOSIS — F418 Other specified anxiety disorders: Secondary | ICD-10-CM | POA: Diagnosis not present

## 2019-03-10 DIAGNOSIS — E785 Hyperlipidemia, unspecified: Secondary | ICD-10-CM | POA: Diagnosis not present

## 2019-03-10 MED ORDER — TADALAFIL 20 MG PO TABS: 10.0000 mg | ORAL_TABLET | ORAL | 11 refills | Status: DC | PRN

## 2019-03-10 MED ORDER — HYDROXYZINE HCL 25 MG PO TABS: 12.5000 mg | ORAL_TABLET | Freq: Every evening | ORAL | 0 refills | Status: DC | PRN

## 2019-03-10 MED ORDER — ALPRAZOLAM 0.25 MG PO TABS: 0.1250 mg | ORAL_TABLET | Freq: Every day | ORAL | 0 refills | Status: DC | PRN

## 2019-03-10 NOTE — Patient Instructions (Addendum)
We will try a different medication for erectile dysfunction, can take Cialis in place of Viagra, but that should not need to be taken more than every 2 to 3 days.  Start with lowest effective dose. Please return your convenience between 8 and 10 in the morning for a fasting lab visit to repeat testosterone levels, then if still low can discuss next step.  Work on diet/exercise, and recheck cholesterol in 3-6 months.   Try hydroxyzine in place of xanax and see if that is effective for sleep/anxiety. If not I did write a few of the xanax until we can discuss this further. We can discuss further in next month. Use one OR the other - not both.  See other info below on stress management to see it that helps in place of meds.    Stress Stress is a normal reaction to life events. Stress is what you feel when life demands more than you are used to, or more than you think you can handle. Some stress can be useful, such as studying for a test or meeting a deadline at work. Stress that occurs too often or for too long can cause problems. It can affect your emotional health and interfere with relationships and normal daily activities. Too much stress can weaken your body's defense system (immune system) and increase your risk for physical illness. If you already have a medical problem, stress can make it worse. What are the causes? All sorts of life events can cause stress. An event that causes stress for one person may not be stressful for another person. Major life events, whether positive or negative, commonly cause stress. Examples include:  Losing a job or starting a new job.  Losing a loved one.  Moving to a new town or home.  Getting married or divorced.  Having a baby.  Injury or illness. Less obvious life events can also cause stress, especially if they occur day after day or in combination with each other. Examples include:  Working long hours.  Driving in traffic.  Caring for  children.  Being in debt.  Being in a difficult relationship. What are the signs or symptoms? Stress can cause emotional symptoms, including:  Anxiety. This is feeling worried, afraid, on edge, overwhelmed, or out of control.  Anger, including irritation or impatience.  Depression. This is feeling sad, down, helpless, or guilty.  Trouble focusing, remembering, or making decisions. Stress can cause physical symptoms, including:  Aches and pains. These may affect your head, neck, back, stomach, or other areas of your body.  Tight muscles or a clenched jaw.  Low energy.  Trouble sleeping. Stress can cause unhealthy behaviors, including:  Eating to feel better (overeating) or skipping meals.  Working too much or putting off tasks.  Smoking, drinking alcohol, or using drugs to feel better. How is this diagnosed? Stress is diagnosed through an assessment by your health care provider. He or she may diagnose this condition based on:  Your symptoms and any stressful life events.  Your medical history.  Tests to rule out other causes of your symptoms. Depending on your condition, your health care provider may refer you to a specialist for further evaluation. How is this treated?  Stress management techniques are the recommended treatment for stress. Medicine is not typically recommended for the treatment of stress. Techniques to reduce your reaction to stressful life events include:  Stress identification. Monitor yourself for symptoms of stress and identify what causes stress for you.  These skills may help you to avoid or prepare for stressful events.  Time management. Set your priorities, keep a calendar of events, and learn to say "no." Taking these actions can help you avoid making too many commitments. Techniques for coping with stress include:  Rethinking the problem. Try to think realistically about stressful events rather than ignoring them or overreacting. Try to find  the positives in a stressful situation rather than focusing on the negatives.  Exercise. Physical exercise can release both physical and emotional tension. The key is to find a form of exercise that you enjoy and do it regularly.  Relaxation techniques. These relax the body and mind. The key is to find one or more that you enjoy and use the technique(s) regularly. Examples include: ? Meditation, deep breathing, or progressive relaxation techniques. ? Yoga or tai chi. ? Biofeedback, mindfulness techniques, or journaling. ? Listening to music, being out in nature, or participating in other hobbies.  Practicing a healthy lifestyle. Eat a balanced diet, drink plenty of water, limit or avoid caffeine, and get plenty of sleep.  Having a strong support network. Spend time with family, friends, or other people you enjoy being around. Express your feelings and talk things over with someone you trust. Counseling or talk therapy with a mental health professional may be helpful if you are having trouble managing stress on your own. Follow these instructions at home: Lifestyle   Avoid drugs.  Do not use any products that contain nicotine or tobacco, such as cigarettes and e-cigarettes. If you need help quitting, ask your health care provider.  Limit alcohol intake to no more than 1 drink a day for nonpregnant women and 2 drinks a day for men. One drink equals 12 oz of beer, 5 oz of wine, or 1 oz of hard liquor.  Do not use alcohol or drugs to relax.  Eat a balanced diet that includes fresh fruits and vegetables, whole grains, lean meats, fish, eggs, and beans, and low-fat dairy. Avoid processed foods and foods high in added fat, sugar, and salt.  Exercise at least 30 minutes on 5 or more days each week.  Get 7-8 hours of sleep each night. General instructions   Practice stress management techniques as discussed with your health care provider.  Drink enough fluid to keep your urine clear or  pale yellow.  Take over-the-counter and prescription medicines only as told by your health care provider.  Keep all follow-up visits as told by your health care provider. This is important. Contact a health care provider if:  Your symptoms get worse.  You have new symptoms.  You feel overwhelmed by your problems and can no longer manage them on your own. Get help right away if:  You have thoughts of hurting yourself or others. If you ever feel like you may hurt yourself or others, or have thoughts about taking your own life, get help right away. You can go to your nearest emergency department or call:  Your local emergency services (911 in the [place]  A suicide crisis helpline, such as the [place] at 438-617-7907. This is open 24 hours a day. Summary  Stress is a normal reaction to life events. It can cause problems if it happens too often or for too long.  Practicing stress management techniques is the best way to treat stress.  Counseling or talk therapy with a mental health professional may be helpful if you are having trouble managing stress on your own.  This information is not intended to replace advice given to you by your health care provider. Make sure you discuss any questions you have with your health care provider. Document Released: 02/10/2001 Document Revised: 07/30/2017 Document Reviewed: 10/07/2016 Elsevier Patient Education  [employer].  If you have lab work done today you will be contacted with your lab results within the next 2 weeks.  If you have not heard from [place] then please contact us. The fastest way to get your results is to register for My Chart.   IF you received an x-ray today, you will receive an invoice from [facility]. Please contact [facility] at [phone] with questions or concerns regarding your invoice.   IF you received labwork today, you will receive an invoice from [place]. Please  contact [facility] at [phone] with questions or concerns regarding your invoice.   Our billing staff will not be able to assist you with questions regarding bills from these companies.  You will be contacted with the lab results as soon as they are available. The fastest way to get your results is to activate your My Chart account. Instructions are located on the last page of this paperwork. If you have not heard from [place] regarding the results in 2 weeks, please contact this office.

## 2019-03-10 NOTE — Progress Notes (Signed)
Subjective:    Patient ID: Shawn Miller, male    DOB: 12-May-1966, 53 y.o.   MRN: [id number]  HPI Shawn Miller is a 53 y.o. male Presents today for: Chief Complaint  Patient presents with  . Medication Management    and review labs, 1 mnth f/u   Erectile dysfunction: Discussed on physical on June 2.  Borderline low testosterone/free testosterone at that time.  Discussed need for repeat testing between 8 and 10 AM.  has not had repeat test performed yet. rx viagra- not covered, cilais was covered.    Hyperlipidemia:  Lab Results  Component Value Date   CHOL 215 (H) 01/31/2019   HDL 43 01/31/2019   LDLCALC 140 (H) 01/31/2019   TRIG 162 (H) 01/31/2019   CHOLHDL 5.0 01/31/2019   Lab Results  Component Value Date   ALT 28 01/31/2019   AST 19 01/31/2019   ALKPHOS 102 01/31/2019   BILITOT 0.4 01/31/2019  The 10-year ASCVD risk score Mikey Bussing DC Jr., et al., 2013) is: 12.8%   Values used to calculate the score:     Age: 84 years     Sex: Male     Is Non-Hispanic African American: Yes     Diabetic: No     Tobacco smoker: Yes     Systolic Blood Pressure: 350 mmHg     Is BP treated: No     HDL Cholesterol: 43 mg/dL     Total Cholesterol: 215 mg/dL Not currently on statin. Option of diet and exercise for wt loss with repeat test in 3-6 months. Going to buy bike today for exercise.   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 Takes 1/2 pill once per week.  Does not take when driving or about to drive.   Controlled substance database (PDMP) reviewed. No concerns appreciated. Last Rx #4 05/2018. #30 in 07/2017.    Depression screen [facility] 2/9 01/31/2019 01/10/2019 02/14/2018 09/06/2017 08/19/2017  Decreased Interest 0 0 0 0 0  Down, Depressed, Hopeless 0 0 0 0 0  PHQ - 2 Score 0 0 0 0 0           Results for orders placed or performed in visit on 01/31/19  Lipid Panel  Result Value Ref Range   Cholesterol, Total 215 (H) 100 - 199 mg/dL    Triglycerides 162 (H) 0 - 149 mg/dL   HDL 43 >39 mg/dL   VLDL Cholesterol Cal 32 5 - 40 mg/dL   LDL Calculated 140 (H) 0 - 99 mg/dL   Chol/HDL Ratio 5.0 0.0 - 5.0 ratio  PSA  Result Value Ref Range   Prostate Specific Ag, Serum 0.7 0.0 - 4.0 ng/mL  Comprehensive metabolic panel  Result Value Ref Range   Glucose 88 65 - 99 mg/dL   BUN 11 6 - 24 mg/dL   Creatinine, Ser 0.97 0.76 - 1.27 mg/dL   GFR calc non Af Amer 88 >59 mL/min/1.73   GFR calc Af Amer 101 >59 mL/min/1.73   BUN/Creatinine Ratio 11 9 - 20   Sodium 142 134 - 144 mmol/L   Potassium 4.8 3.5 - 5.2 mmol/L   Chloride 102 96 - 106 mmol/L   CO2 23 20 - 29 mmol/L   Calcium 10.2 8.7 - 10.2 mg/dL   Total Protein 7.6 6.0 - 8.5 g/dL   Albumin 4.9 3.8 - 4.9 g/dL   Globulin, Total 2.7 1.5 - 4.5 g/dL   Albumin/Globulin  Ratio 1.8 1.2 - 2.2   Bilirubin Total 0.4 0.0 - 1.2 mg/dL   Alkaline Phosphatase 102 39 - 117 IU/L   AST 19 0 - 40 IU/L   ALT 28 0 - 44 IU/L  Hepatitis C antibody  Result Value Ref Range   Hep C Virus Ab <0.1 0.0 - 0.9 s/co ratio  HIV Antibody (routine testing w rflx)  Result Value Ref Range   HIV Screen 4th Generation wRfx Non Reactive Non Reactive  TSH  Result Value Ref Range   TSH 0.784 0.450 - 4.500 uIU/mL  Testosterone, Free, Total, SHBG  Result Value Ref Range   Testosterone 264 264 - 916 ng/dL   Testosterone, Free 5.8 (L) 7.2 - 24.0 pg/mL   Sex Hormone Binding 19.8 19.3 - 76.4 nmol/L      Patient Active Problem List   Diagnosis Date Noted  . Rash and nonspecific skin eruption 01/10/2019  . Headache 05/09/2018   Past Medical History:  Diagnosis Date  . Anxiety   . Headache    Past Surgical History:  Procedure Laterality Date  . JOINT REPLACEMENT  2003   per patient neck-3 screws and 3 plate  . posterior neck surgery     Allergies  Allergen Reactions  . Gadolinium Derivatives Cough    Dr. Jimmye Norman s/w patient after patient  began coughing post injection. Patient unsure if he just  got choked or if it was the contrast. No treatment expect for 20 minute observation. ry   Prior to Admission medications   Medication Sig Start Date End Date Taking? Authorizing Provider  ALPRAZolam (XANAX) 0.25 MG tablet Take 1-2 tabs (0.36m-0.50mg) 30-60 minutes before procedure. May repeat if needed.Do not drive. 05/27/18  Yes AMelvenia Beam MD  sildenafil (VIAGRA) 50 MG tablet Take 0.5-1 tablets (25-50 mg total) by mouth daily as needed for erectile dysfunction. Patient not taking: Reported on 03/10/2019 01/31/19   GWendie Agreste MD   Social History   Socioeconomic History  . Marital status: Married    Spouse name: Not on file  . Number of children: Not on file  . Years of education: Not on file  . Highest education level: Not on file  Occupational History  . Occupation: transportation  Social Needs  . Financial resource strain: Not on file  . Food insecurity    Worry: Not on file    Inability: Not on file  . Transportation needs    Medical: Not on file    Non-medical: Not on file  Tobacco Use  . Smoking status: Current Every Day Smoker    Packs/day: 1.00    Years: 33.00    Pack years: 33.00    Types: Cigarettes  . Smokeless tobacco: Never Used  Substance and Sexual Activity  . Alcohol use: Yes  . Drug use: No  . Sexual activity: Not on file  Lifestyle  . Physical activity    Days per week: Not on file    Minutes per session: Not on file  . Stress: Not on file  Relationships  . Social c[place]bal[place] phone: Not on file    Gets together: Not on file    Attends religious service: Not on file    Active member of club or organization: Not on file    Attends meetings of clubs or organizations: Not on file    Relationship status: Not on file  . Intimate partner violence    Fear of current or ex partner:  Not on file    Emotionally abused: Not on file    Physically abused: Not on file    Forced sexual activity: Not on file  Other Topics Concern  . Not  on file  Social History Narrative   Exercise walking 2 miles two times/week      Review of Systems     Objective:   Physical Exam Constitutional:      General: He is not in acute distress.    Appearance: He is well-developed.  HENT:     Head: Normocephalic and atraumatic.  Cardiovascular:     Rate and Rhythm: Normal rate and regular rhythm.     Heart sounds: Normal heart sounds.  Pulmonary:     Effort: Pulmonary effort is normal.  Neurological:     Mental Status: He is alert and oriented to person, place, and time.     Vitals:   03/10/19 1330  BP: 133/80  Pulse: 84  Resp: 18  Temp: 97.6 F (36.4 C)  SpO2: 98%  Weight: 265 lb 12.8 oz (120.6 kg)  Height: 6' (1.829 m)       Assessment & Plan:    Shawn Miller is a 53 y.o. male Erectile dysfunction, unspecified erectile dysfunction type - Plan: tadalafil (CIALIS) 20 MG tablet  -Cialis has better insurance coverage.  We will try this in place of Viagra.  Still discussed potential side effects and risks with understanding expressed.  Timing of dosing also discussed.  -Borderline testosterone previously, plan for lab only visit between 8 and 10 in the morning.  Hyperlipidemia, unspecified hyperlipidemia type  -Plan for diet/exercise changes initially with repeat testing in 3 to 6 months.  Recommended avoidance of fast food, sugar-containing beverages, smaller portion sizes, and ideally preparing own food.   Situational anxiety - Plan: ALPRAZolam (XANAX) 0.25 MG tablet, hydrOXYzine (ATARAX/VISTARIL) 25 MG tablet  -Rare use of Xanax previously, but option of hydroxyzine given.  Low-dose initially.  Refilled Xanax as well if hydroxyzine ineffective given prior rare use and effectiveness.  -Plans to follow-up within the next month or so to discuss further  Meds ordered this encounter  Medications  . ALPRAZolam (XANAX) 0.25 MG tablet    Sig: Take 0.5-1 tablets (0.125-0.25 mg total) by mouth daily as needed for anxiety. T     Dispense:  10 tablet    Refill:  0  . tadalafil (CIALIS) 20 MG tablet    Sig: Take 0.5-1 tablets (10-20 mg total) by mouth every other day as needed for erectile dysfunction.    Dispense:  5 tablet    Refill:  11  . hydrOXYzine (ATARAX/VISTARIL) 25 MG tablet    Sig: Take 0.5-1 tablets (12.5-25 mg total) by mouth at bedtime as needed for anxiety.    Dispense:  15 tablet    Refill:  0   Patient Instructions      We will try a different medication for erectile dysfunction, can take Cialis in place of Viagra, but that should not need to be taken more than every 2 to 3 days.  Start with lowest effective dose. Please return your convenience between 8 and 10 in the morning for a fasting lab visit to repeat testosterone levels, then if still low can discuss next step.  Work on diet/exercise, and recheck cholesterol in 3-6 months.   Try hydroxyzine in place of xanax and see if that is effective for sleep/anxiety. If not I did write a few of the xanax until we can discuss  this further. We can discuss further in next month. Use one OR the other - not both.  See other info below on stress management to see it that helps in place of meds.    Stress Stress is a normal reaction to life events. Stress is what you feel when life demands more than you are used to, or more than you think you can handle. Some stress can be useful, such as studying for a test or meeting a deadline at work. Stress that occurs too often or for too long can cause problems. It can affect your emotional health and interfere with relationships and normal daily activities. Too much stress can weaken your body's defense system (immune system) and increase your risk for physical illness. If you already have a medical problem, stress can make it worse. What are the causes? All sorts of life events can cause stress. An event that causes stress for one person may not be stressful for another person. Major life events, whether  positive or negative, commonly cause stress. Examples include:  Losing a job or starting a new job.  Losing a loved one.  Moving to a new town or home.  Getting married or divorced.  Having a baby.  Injury or illness. Less obvious life events can also cause stress, especially if they occur day after day or in combination with each other. Examples include:  Working long hours.  Driving in traffic.  Caring for children.  Being in debt.  Being in a difficult relationship. What are the signs or symptoms? Stress can cause emotional symptoms, including:  Anxiety. This is feeling worried, afraid, on edge, overwhelmed, or out of control.  Anger, including irritation or impatience.  Depression. This is feeling sad, down, helpless, or guilty.  Trouble focusing, remembering, or making decisions. Stress can cause physical symptoms, including:  Aches and pains. These may affect your head, neck, back, stomach, or other areas of your body.  Tight muscles or a clenched jaw.  Low energy.  Trouble sleeping. Stress can cause unhealthy behaviors, including:  Eating to feel better (overeating) or skipping meals.  Working too much or putting off tasks.  Smoking, drinking alcohol, or using drugs to feel better. How is this diagnosed? Stress is diagnosed through an assessment by your health care provider. He or she may diagnose this condition based on:  Your symptoms and any stressful life events.  Your medical history.  Tests to rule out other causes of your symptoms. Depending on your condition, your health care provider may refer you to a specialist for further evaluation. How is this treated?  Stress management techniques are the recommended treatment for stress. Medicine is not typically recommended for the treatment of stress. Techniques to reduce your reaction to stressful life events include:  Stress identification. Monitor yourself for symptoms of stress and identify  what causes stress for you. These skills may help you to avoid or prepare for stressful events.  Time management. Set your priorities, keep a calendar of events, and learn to say "no." Taking these actions can help you avoid making too many commitments. Techniques for coping with stress include:  Rethinking the problem. Try to think realistically about stressful events rather than ignoring them or overreacting. Try to find the positives in a stressful situation rather than focusing on the negatives.  Exercise. Physical exercise can release both physical and emotional tension. The key is to find a form of exercise that you enjoy and do it regularly.  Relaxation  techniques. These relax the body and mind. The key is to find one or more that you enjoy and use the technique(s) regularly. Examples include: ? Meditation, deep breathing, or progressive relaxation techniques. ? Yoga or tai chi. ? Biofeedback, mindfulness techniques, or journaling. ? Listening to music, being out in nature, or participating in other hobbies.  Practicing a healthy lifestyle. Eat a balanced diet, drink plenty of water, limit or avoid caffeine, and get plenty of sleep.  Having a strong support network. Spend time with family, friends, or other people you enjoy being around. Express your feelings and talk things over with someone you trust. Counseling or talk therapy with a mental health professional may be helpful if you are having trouble managing stress on your own. Follow these instructions at home: Lifestyle   Avoid drugs.  Do not use any products that contain nicotine or tobacco, such as cigarettes and e-cigarettes. If you need help quitting, ask your health care provider.  Limit alcohol intake to no more than 1 drink a day for nonpregnant women and 2 drinks a day for men. One drink equals 12 oz of beer, 5 oz of wine, or 1 oz of hard liquor.  Do not use alcohol or drugs to relax.  Eat a balanced diet that  includes fresh fruits and vegetables, whole grains, lean meats, fish, eggs, and beans, and low-fat dairy. Avoid processed foods and foods high in added fat, sugar, and salt.  Exercise at least 30 minutes on 5 or more days each week.  Get 7-8 hours of sleep each night. General instructions   Practice stress management techniques as discussed with your health care provider.  Drink enough fluid to keep your urine clear or pale yellow.  Take over-the-counter and prescription medicines only as told by your health care provider.  Keep all follow-up visits as told by your health care provider. This is important. Contact a health care provider if:  Your symptoms get worse.  You have new symptoms.  You feel overwhelmed by your problems and can no longer manage them on your own. Get help right away if:  You have thoughts of hurting yourself or others. If you ever feel like you may hurt yourself or others, or have thoughts about taking your own life, get help right away. You can go to your nearest emergency department or call:  Your local emergency services (911 in the [place]  A suicide crisis helpline, such as the [place] at 714 761 6226. This is open 24 hours a day. Summary  Stress is a normal reaction to life events. It can cause problems if it happens too often or for too long.  Practicing stress management techniques is the best way to treat stress.  Counseling or talk therapy with a mental health professional may be helpful if you are having trouble managing stress on your own. This information is not intended to replace advice given to you by your health care provider. Make sure you discuss any questions you have with your health care provider. Document Released: 02/10/2001 Document Revised: 07/30/2017 Document Reviewed: 10/07/2016 Elsevier Patient Education  [employer].  If you have lab work done today you will be contacted with your lab  results within the next 2 weeks.  If you have not heard from [place] then please contact us. The fastest way to get your results is to register for My Chart.   IF you received an x-ray today, you will receive an invoice from [facility]  Radiology. Please contact [facility] at [phone] with questions or concerns regarding your invoice.   IF you received labwork today, you will receive an invoice from [facility]. Please contact [facility] at [phone] with questions or concerns regarding your invoice.   Our billing staff will not be able to assist you with questions regarding bills from these companies.  You will be contacted with the lab results as soon as they are available. The fastest way to get your results is to activate your My Chart account. Instructions are located on the last page of this paperwork. If you have not heard from [place] regarding the results in 2 weeks, please contact this office.       Signed,   Merri Ray, MD [facility].  03/11/19 10:12 AM

## 2019-03-11 ENCOUNTER — Encounter: Payer: Self-pay | Admitting: Family Medicine

## 2019-03-17 ENCOUNTER — Telehealth: Payer: Self-pay | Admitting: Family Medicine

## 2019-03-17 NOTE — Telephone Encounter (Signed)
Patient was denied the medication Tadalafil tabs by his insurance company since it was not medically necessary per denial letter. Letter placed in the providers box at nurses station.

## 2019-04-14 ENCOUNTER — Ambulatory Visit: Payer: 59 | Admitting: Family Medicine

## 2019-04-17 ENCOUNTER — Encounter: Payer: Self-pay | Admitting: Family Medicine

## 2019-07-04 ENCOUNTER — Telehealth: Payer: Self-pay | Admitting: Family Medicine

## 2019-07-04 ENCOUNTER — Encounter: Payer: Self-pay | Admitting: Emergency Medicine

## 2019-07-04 ENCOUNTER — Telehealth: Payer: Self-pay | Admitting: *Deleted

## 2019-07-04 ENCOUNTER — Telehealth (INDEPENDENT_AMBULATORY_CARE_PROVIDER_SITE_OTHER): Payer: 59 | Admitting: Emergency Medicine

## 2019-07-04 VITALS — Ht 74.0 in | Wt 252.0 lb

## 2019-07-04 DIAGNOSIS — K122 Cellulitis and abscess of mouth: Secondary | ICD-10-CM

## 2019-07-04 DIAGNOSIS — K1379 Other lesions of oral mucosa: Secondary | ICD-10-CM | POA: Diagnosis not present

## 2019-07-04 DIAGNOSIS — B349 Viral infection, unspecified: Secondary | ICD-10-CM

## 2019-07-04 MED ORDER — MAGIC MOUTHWASH W/LIDOCAINE: 5.0000 mL | Freq: Four times a day (QID) | ORAL | 1 refills | Status: DC | PRN

## 2019-07-04 MED ORDER — MAGIC MOUTHWASH W/LIDOCAINE: 5.0000 mL | Freq: Three times a day (TID) | ORAL | 1 refills | Status: DC | PRN

## 2019-07-04 NOTE — Telephone Encounter (Signed)
Shawn Miller from [facility] is needing to know the ratio and ingredients for prescriptions     [address], [address]  [place] [address]  Phone: [phone] Fax: [phone]

## 2019-07-04 NOTE — Telephone Encounter (Signed)
Entered in error

## 2019-07-04 NOTE — Telephone Encounter (Signed)
Patient states that medication (magic mouthwash w/lidocaine SOLN ) was not sent to pharmacy.  Patient called pharmacy and they do not have presciption.    PHARMACY [facility] [place], [place]  Patient call back [phone]

## 2019-07-04 NOTE — Addendum Note (Signed)
Addended by: Davina Poke on: 07/04/2019 05:02 PM   Modules accepted: Orders

## 2019-07-04 NOTE — Addendum Note (Signed)
Addended by: Davina Poke on: 07/04/2019 01:52 PM   Modules accepted: Orders

## 2019-07-04 NOTE — Progress Notes (Signed)
Telemedicine Encounter- SOAP NOTE Established Patient  This telephone encounter was conducted with the patient's (or proxy's) verbal consent via audio telecommunications: yes/no: Yes Patient was instructed to have this encounter in a suitably private space; and to only have persons present to whom they give permission to participate. In addition, patient identity was confirmed by use of name plus two identifiers (DOB and address).  I discussed the limitations, risks, security and privacy concerns of performing an evaluation and management service by telephone and the availability of in person appointments. I also discussed with the patient that there may be a patient responsible charge related to this service. The patient expressed understanding and agreed to proceed.  I spent a total of TIME; 0 MIN TO 60 MIN: 15 minutes talking with the patient or their proxy.  No chief complaint on file. Mouth pain and blisters  Subjective   Shawn Miller is a 53 y.o. male established patient. Telephone visit today complaining of painful blisters to his tongue that started today at 1 AM.  No other associated symptoms.  Denies fever or chills.  Has had similar episodes in the past with good relief from Magic mouthwash.  Requesting refill.  HPI   There are no active problems to display for this patient.   Past Medical History:  Diagnosis Date  . Anxiety   . Headache     Current Outpatient Medications  Medication Sig Dispense Refill  . ALPRAZolam (XANAX) 0.25 MG tablet Take 0.5-1 tablets (0.125-0.25 mg total) by mouth daily as needed for anxiety. T 10 tablet 0  . hydrOXYzine (ATARAX/VISTARIL) 25 MG tablet Take 0.5-1 tablets (12.5-25 mg total) by mouth at bedtime as needed for anxiety. 15 tablet 0  . sildenafil (VIAGRA) 50 MG tablet Take 0.5-1 tablets (25-50 mg total) by mouth daily as needed for erectile dysfunction. (Patient not taking: Reported on 03/10/2019) 6 tablet 3  . tadalafil (CIALIS) 20  MG tablet Take 0.5-1 tablets (10-20 mg total) by mouth every other day as needed for erectile dysfunction. 5 tablet 11   No current facility-administered medications for this visit.     Allergies  Allergen Reactions  . Gadolinium Derivatives Cough    Dr. Mayford Knife s/w patient after patient  began coughing post injection. Patient unsure if he just got choked or if it was the contrast. No treatment expect for 20 minute observation. ry    Social History   Socioeconomic History  . Marital status: Married    Spouse name: Not on file  . Number of children: Not on file  . Years of education: Not on file  . Highest education level: Not on file  Occupational History  . Occupation: transportation  Social Needs  . Financial resource strain: Not on file  . Food insecurity    Worry: Not on file    Inability: Not on file  . Transportation needs    Medical: Not on file    Non-medical: Not on file  Tobacco Use  . Smoking status: Current Every Day Smoker    Packs/day: 1.00    Years: 33.00    Pack years: 33.00    Types: Cigarettes  . Smokeless tobacco: Never Used  Substance and Sexual Activity  . Alcohol use: Yes  . Drug use: No  . Sexual activity: Not on file  Lifestyle  . Physical activity    Days per week: Not on file    Minutes per session: Not on file  . Stress: Not on file  Relationships  . Social Herbalist on phone: Not on file    Gets together: Not on file    Attends religious service: Not on file    Active member of club or organization: Not on file    Attends meetings of clubs or organizations: Not on file    Relationship status: Not on file  . Intimate partner violence    Fear of current or ex partner: Not on file    Emotionally abused: Not on file    Physically abused: Not on file    Forced sexual activity: Not on file  Other Topics Concern  . Not on file  Social History Narrative   Exercise walking 2 miles two times/week    Review of Systems   Constitutional: Negative.  Negative for chills and fever.  HENT: Negative for congestion and sore throat.        Mouth pain  Respiratory: Negative.  Negative for cough and shortness of breath.   Cardiovascular: Negative.  Negative for chest pain and palpitations.  Gastrointestinal: Negative.  Negative for abdominal pain, diarrhea, nausea and vomiting.  Musculoskeletal: Negative.  Negative for myalgias.  Skin: Negative.  Negative for rash.  Neurological: Negative.  Negative for dizziness and headaches.  Endo/Heme/Allergies: Negative.   All other systems reviewed and are negative.   Objective  Alert and oriented x3 in no apparent respiratory distress. Vitals as reported by the patient: There were no vitals filed for this visit.  There are no diagnoses linked to this encounter. Diagnoses and all orders for this visit:  Mouth pain -     magic mouthwash w/lidocaine SOLN; Take 5 mLs by mouth 3 (three) times daily as needed for mouth pain.  Infection of mouth  Viral infection     I discussed the assessment and treatment plan with the patient. The patient was provided an opportunity to ask questions and all were answered. The patient agreed with the plan and demonstrated an understanding of the instructions.   The patient was advised to call back or seek an in-person evaluation if the symptoms worsen or if the condition fails to improve as anticipated.  I provided 15 minutes of non-face-to-face time during this encounter.  Horald Pollen, MD  Primary Care at [facility]

## 2019-07-04 NOTE — Progress Notes (Signed)
Called patient to triage for appointment. Patient states it started this morning, he has sores on the right side of tongue near the tip center.

## 2019-07-04 NOTE — Telephone Encounter (Signed)
Spoke to patient to let him know prescription was faxed to pharmacy, not e-scribed. Advised patient to call the pharmacy. Confirmation page 4:50 pm.

## 2019-07-04 NOTE — Telephone Encounter (Signed)
Re faxed the Rx for Magic mouthwash with Lidocaine Soln and formula. Confirmation page 5:07 pm.

## 2019-07-05 NOTE — Telephone Encounter (Signed)
Rx sent yesterday

## 2019-11-20 IMAGING — CR DG RIBS W/ CHEST 3+V*L*
5 series · 5 of 5 positions shown · non-contrast
Comparison: Chest radiograph dated 03/11/2018

CLINICAL DATA: 52-year-old male with left-sided pain.

EXAM:
LEFT RIBS AND CHEST - 3+ VIEW

[chest pa]
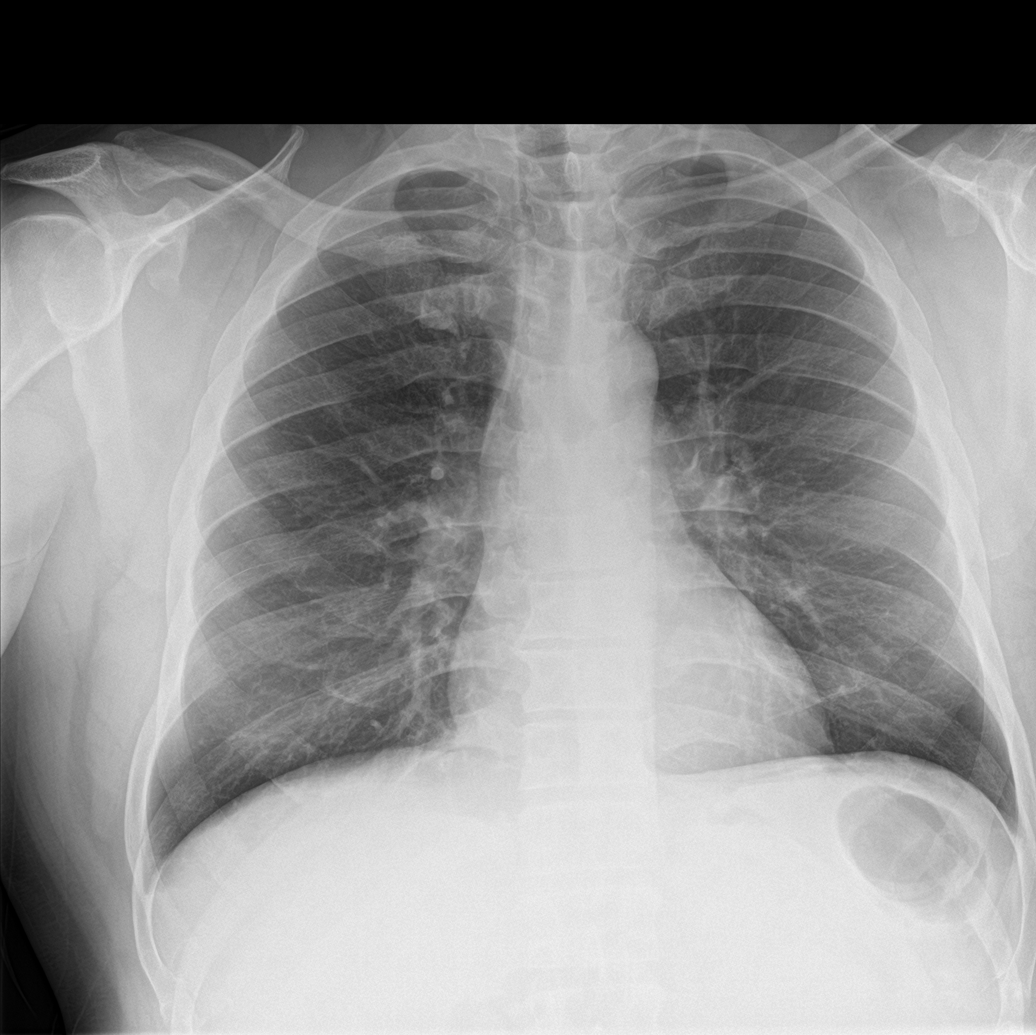

[rib ap (1 of 2)]
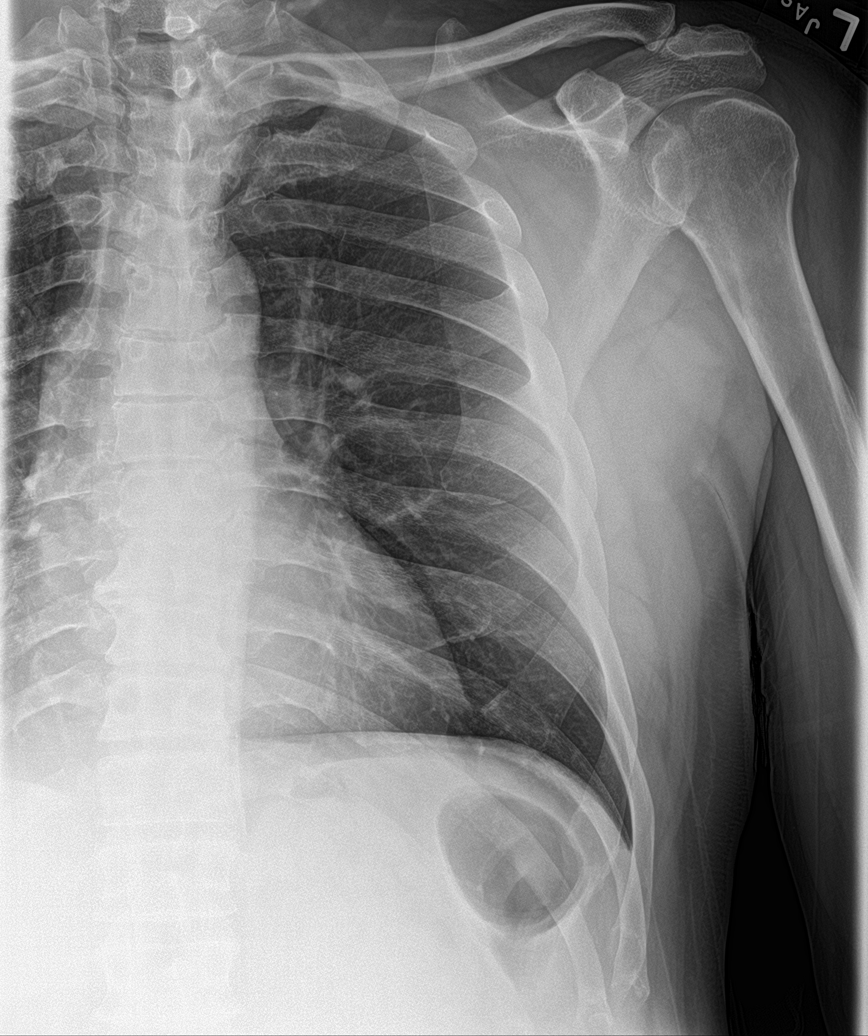

[rib ap obl (1 of 2)]
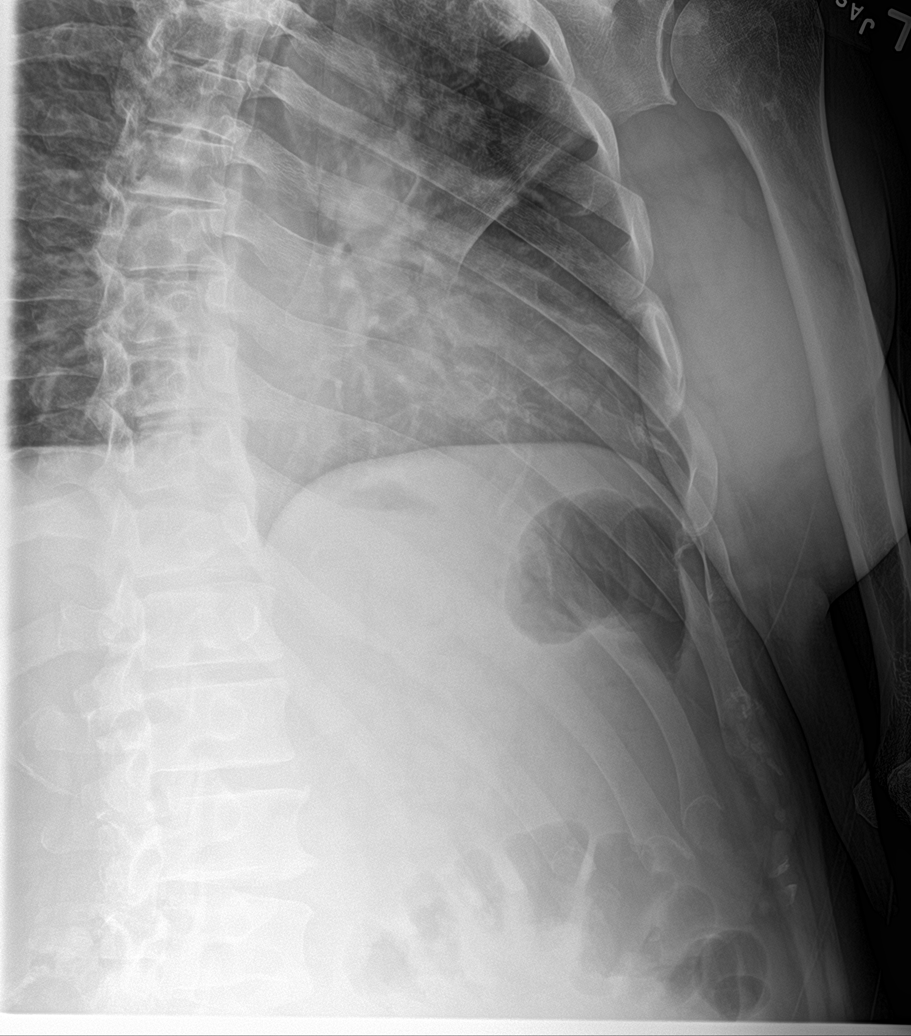

[rib ap (2 of 2)]
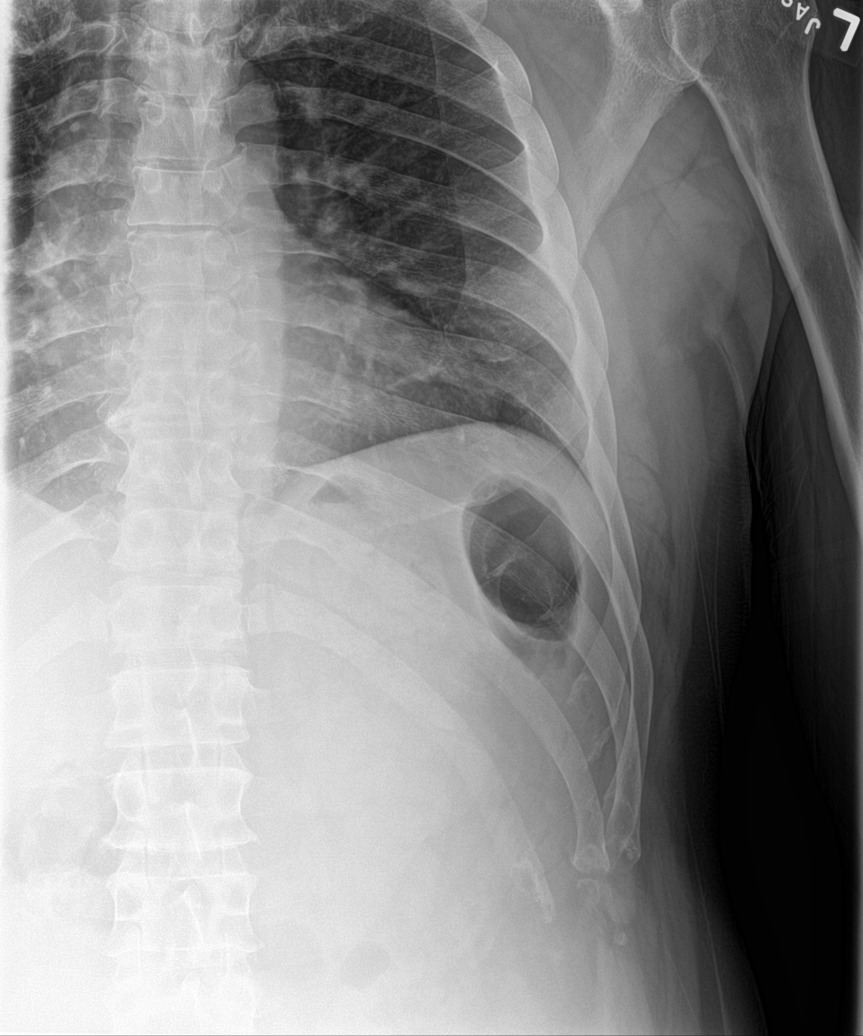

[rib ap obl (2 of 2)]
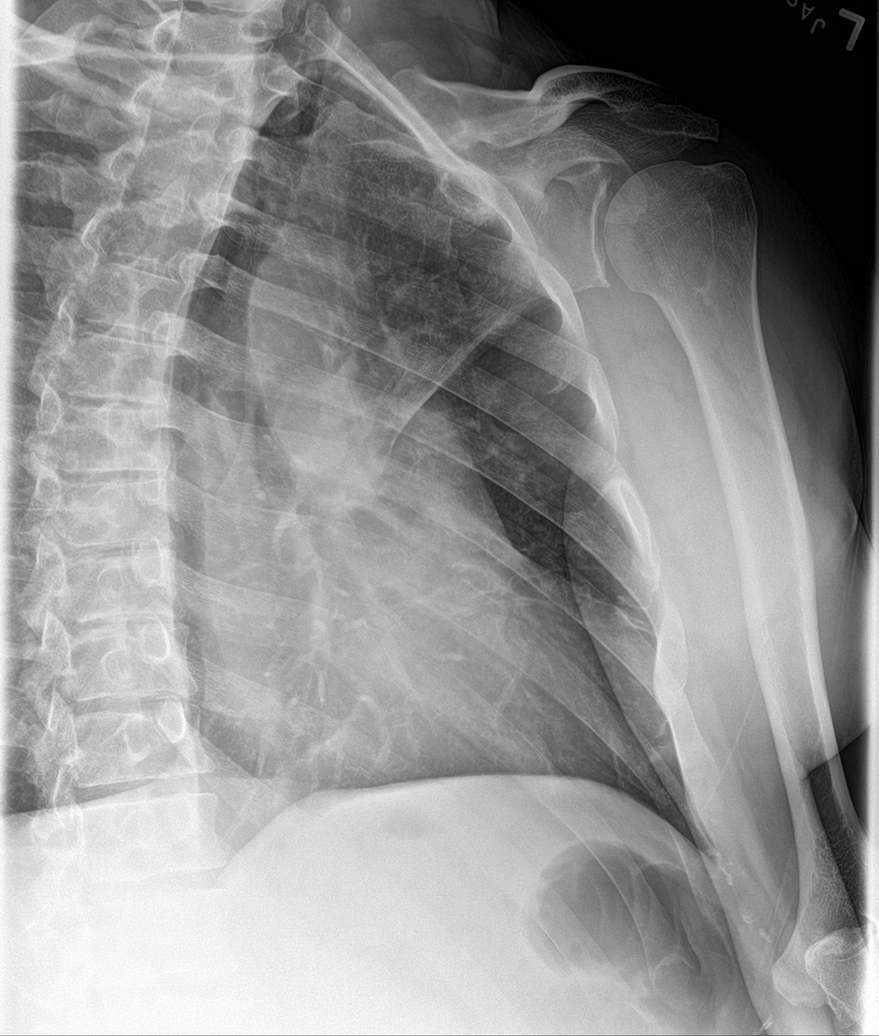

[5 of 5 positions shown; findings below may reference images not displayed]

FINDINGS: The lungs are clear. There is no pleural effusion or pneumothorax.
The cardiac silhouette is within normal limits. No acute osseous
pathology. No rib fractures.
IMPRESSION: Negative.

## 2019-12-01 DIAGNOSIS — F419 Anxiety disorder, unspecified: Secondary | ICD-10-CM | POA: Insufficient documentation

## 2020-11-22 ENCOUNTER — Ambulatory Visit (INDEPENDENT_AMBULATORY_CARE_PROVIDER_SITE_OTHER): Payer: PRIVATE HEALTH INSURANCE | Admitting: Family Medicine

## 2020-11-22 ENCOUNTER — Encounter: Payer: Self-pay | Admitting: Family Medicine

## 2020-11-22 VITALS — BP 119/74 | HR 96 | Temp 97.3°F | Ht 74.0 in | Wt 258.0 lb

## 2020-11-22 DIAGNOSIS — R319 Hematuria, unspecified: Secondary | ICD-10-CM | POA: Diagnosis not present

## 2020-11-22 DIAGNOSIS — F418 Other specified anxiety disorders: Secondary | ICD-10-CM | POA: Diagnosis not present

## 2020-11-22 LAB — POCT URINALYSIS DIP (MANUAL ENTRY)
Bilirubin, UA: NEGATIVE
Glucose, UA: NEGATIVE mg/dL
Ketones, POC UA: NEGATIVE mg/dL
Leukocytes, UA: NEGATIVE
Nitrite, UA: NEGATIVE
Protein Ur, POC: NEGATIVE mg/dL
Spec Grav, UA: 1.025 (ref 1.010–1.025)
Urobilinogen, UA: 0.2 E.U./dL
pH, UA: 6 (ref 5.0–8.0)

## 2020-11-22 MED ORDER — ALPRAZOLAM 0.25 MG PO TABS: 0.1250 mg | ORAL_TABLET | Freq: Every day | ORAL | 0 refills | Status: DC | PRN

## 2020-11-22 NOTE — Patient Instructions (Addendum)
Would like to recheck the urine with a microscopy, and that can be ordered/performed through the outpatient lab.  See location below.  If that test does show red blood cells I will refer you to a urologist.  Make sure to drink plenty of fluids throughout the day, especially when exercising.  Follow-up in the next 6 months for physical.  If you notice any blood in the urine or new symptoms, let me know right away.  Here are a few lab drawing stations for you to have your labwork performed:  [facility] [address] [place], [place] [address]  [facility] [address] [place], [place] [address]     Hematuria, Adult Hematuria is blood in the urine. Blood may be visible in the urine, or it may be identified with a test. This condition can be caused by infections of the bladder, urethra, kidney, or prostate. Other possible causes include:  Kidney stones.  Cancer of the urinary tract.  Too much calcium in the urine.  Conditions that are passed from parent to child (inherited conditions).  Exercise that requires a lot of energy. Infections can usually be treated with medicine, and a kidney stone usually will pass through your urine. If neither of these is the cause of your hematuria, more tests may be needed to identify the cause of your symptoms. It is very important to tell your health care provider about any blood in your urine, even if it is painless or the blood stops without treatment. Blood in the urine, when it happens and then stops and then happens again, can be a symptom of a very serious condition, including cancer. There is no pain in the initial stages of many urinary cancers. Follow these instructions at home: Medicines  Take over-the-counter and prescription medicines only as told by your health care provider.  If you were prescribed an antibiotic medicine, take it as told by your health care provider. Do not stop taking the antibiotic even if you start to feel  better. Eating and drinking  Drink enough fluid to keep your urine pale yellow. It is recommended that you drink 3-4 quarts (2.8-3.8 L) a day. If you have been diagnosed with an infection, drinking cranberry juice in addition to large amounts of water is recommended.  Avoid caffeine, tea, and carbonated beverages. These tend to irritate the bladder.  Avoid alcohol because it may irritate the prostate (in males). General instructions  If you have been diagnosed with a kidney stone, follow your health care provider's instructions about straining your urine to catch the stone.  Empty your bladder often. Avoid holding urine for long periods of time.  If you are male: ? After a bowel movement, wipe from front to back and use each piece of toilet paper only once. ? Empty your bladder before and after sex.  Pay attention to any changes in your symptoms. Tell your health care provider about any changes or any new symptoms.  It is up to you to get the results of any tests. Ask your health care provider, or the department that is doing the test, when your results will be ready.  Keep all follow-up visits. This is important. Contact a health care provider if:  You develop back pain.  You have a fever or chills.  You have nausea or vomiting.  Your symptoms do not improve after 3 days.  Your symptoms get worse. Get help right away if:  You develop severe vomiting and are unable to  take medicine without vomiting.  You develop severe pain in your back or abdomen even though you are taking medicine.  You pass a large amount of blood in your urine.  You pass blood clots in your urine.  You feel very weak or like you might faint.  You faint. Summary  Hematuria is blood in the urine. It has many possible causes.  It is very important that you tell your health care provider about any blood in your urine, even if it is painless or the blood stops without treatment.  Take  over-the-counter and prescription medicines only as told by your health care provider.  Drink enough fluid to keep your urine pale yellow. This information is not intended to replace advice given to you by your health care provider. Make sure you discuss any questions you have with your health care provider. Document Revised: 04/17/2020 Document Reviewed: 04/17/2020 Elsevier Patient Education  2021 [employer].    If you have lab work done today you will be contacted with your lab results within the next 2 weeks.  If you have not heard from [place] then please contact us. The fastest way to get your results is to register for My Chart.   IF you received an x-ray today, you will receive an invoice from [facility]. Please contact [facility] at [phone] with questions or concerns regarding your invoice.   IF you received labwork today, you will receive an invoice from [facility]. Please contact [facility] at [phone] with questions or concerns regarding your invoice.   Our billing staff will not be able to assist you with questions regarding bills from these companies.  You will be contacted with the lab results as soon as they are available. The fastest way to get your results is to activate your My Chart account. Instructions are located on the last page of this paperwork. If you have not heard from [place] regarding the results in 2 weeks, please contact this office.

## 2020-11-22 NOTE — Progress Notes (Signed)
Subjective:  Patient ID: Shawn Miller, male    DOB: 02-Sep-1965  Age: 55 y.o. MRN: [id number]  CC:  Chief Complaint  Patient presents with  . 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 PT reports no pain while urinating and no abdominal pain. Pt reports no issues with urine discharge.  . Medication Refill    Pt is requesting a refill on his Xanax. Pt reports this medication works well with no side effects. Pt reports he takes the medication as needed.    HPI [name] presents for   Hematuria: Noted on DOT physical. No gross hematuria. No dysuria/urgency. No hx of kidney stones.  No penile d/c, no testicular pain.  No hx UTI.  Physical work with lifting, unloading truck 2 days per week.  Smoker - 2 packs per week.  No unexplained wt loss, night sweats, fever.   Lab Results  Component Value Date   PSA1 0.7 01/31/2019   PSA 1.04 10/30/2015   Results for orders placed or performed in visit on 11/22/20  POCT urinalysis dipstick  Result Value Ref Range   Color, UA orange (A) yellow   Clarity, UA clear clear   Glucose, UA negative negative mg/dL   Bilirubin, UA negative negative   Ketones, POC UA negative negative mg/dL   Spec Grav, UA 2.440 1.027 - 1.025   Blood, UA trace-intact (A) negative   pH, UA 6.0 5.0 - 8.0   Protein Ur, POC negative negative mg/dL   Urobilinogen, UA 0.2 0.2 or 1.0 E.U./dL   Nitrite, UA Negative Negative   Leukocytes, UA Negative Negative    Situational anxiety: Episodic xanax use. 0.25mg  as needed for sleep.  Controlled substance database (PDMP) reviewed. No concerns appreciated. Last filled 03/10/2019.  Less than once per month, rarely 2 times per month to get to sleep after driving for awhile. Not taking when driving. No side effects.  Depression screen [facility] 2/9 11/22/2020 07/04/2019 01/31/2019 01/10/2019 02/14/2018  Decreased Interest 0 0 0 0 0  Down, Depressed, Hopeless 0 0 0 0 0  PHQ - 2  Score 0 0 0 0 0   Denies depression/anxiety typically.     History There are no problems to display for this patient.  Past Medical History:  Diagnosis Date  . Anxiety   . Headache    Past Surgical History:  Procedure Laterality Date  . JOINT REPLACEMENT  2003   per patient neck-3 screws and 3 plate  . posterior neck surgery     Allergies  Allergen Reactions  . Gadolinium Derivatives Cough    Dr. Mayford Knife s/w patient after patient  began coughing post injection. Patient unsure if he just got choked or if it was the contrast. No treatment expect for 20 minute observation. ry   Prior to Admission medications   Medication Sig Start Date End Date Taking? Authorizing Provider  ALPRAZolam Prudy Feeler) 0.25 MG tablet Take 0.5-1 tablets (0.125-0.25 mg total) by mouth daily as needed for anxiety. T 03/10/19  Yes Shade Flood, MD  hydrOXYzine (ATARAX/VISTARIL) 25 MG tablet Take 0.5-1 tablets (12.5-25 mg total) by mouth at bedtime as needed for anxiety. Patient not taking: No sig reported 03/10/19   Shade Flood, MD  magic mouthwash w/lidocaine SOLN Take 5 mLs by mouth 4 (four) times daily as needed for mouth pain. Swish, gargle, and spit 5 cc every 6 hours as needed. Patient not taking:  Reported on 11/22/2020 07/04/19   Georgina Quint, MD  sildenafil (VIAGRA) 50 MG tablet Take 0.5-1 tablets (25-50 mg total) by mouth daily as needed for erectile dysfunction. Patient not taking: No sig reported 01/31/19   Shade Flood, MD  tadalafil (CIALIS) 20 MG tablet Take 0.5-1 tablets (10-20 mg total) by mouth every other day as needed for erectile dysfunction. Patient not taking: No sig reported 03/10/19   Shade Flood, MD   Social History   Socioeconomic History  . Marital status: Married    Spouse name: Not on file  . Number of children: Not on file  . Years of education: Not on file  . Highest education level: Not on file  Occupational History  . Occupation: transportation   Tobacco Use  . Smoking status: Current Every Day Smoker    Packs/day: 1.00    Years: 33.00    Pack years: 33.00    Types: Cigarettes  . Smokeless tobacco: Never Used  Substance and Sexual Activity  . Alcohol use: Yes  . Drug use: No  . Sexual activity: Not on file  Other Topics Concern  . Not on file  Social History Narrative   Exercise walking 2 miles two times/week   Social Determinants of Health   Financial Resource Strain: Not on file  Food Insecurity: Not on file  Transportation Needs: Not on file  Physical Activity: Not on file  Stress: Not on file  Social Connections: Not on file  Intimate Partner Violence: Not on file    Review of Systems Per HPi  Objective:   Vitals:   11/22/20 0842  BP: 119/74  Pulse: 96  Temp: (!) 97.3 F (36.3 C)  TempSrc: Temporal  SpO2: 97%  Weight: 258 lb (117 kg)  Height: 6\' 2"  (1.88 m)     Physical Exam Vitals reviewed.  Constitutional:      Appearance: He is well-developed.  HENT:     Head: Normocephalic and atraumatic.  Eyes:     Pupils: Pupils are equal, round, and reactive to light.  Neck:     Vascular: No carotid bruit or JVD.  Cardiovascular:     Rate and Rhythm: Normal rate and regular rhythm.     Heart sounds: Normal heart sounds. No murmur heard.   Pulmonary:     Effort: Pulmonary effort is normal.     Breath sounds: Normal breath sounds. No rales.  Abdominal:     General: Abdomen is flat. There is no distension.     Tenderness: There is no abdominal tenderness. There is no right CVA tenderness or left CVA tenderness.  Skin:    General: Skin is warm and dry.  Neurological:     Mental Status: He is alert and oriented to person, place, and time.  Psychiatric:        Mood and Affect: Mood normal.        Behavior: Behavior normal.     Results for orders placed or performed in visit on 11/22/20  POCT urinalysis dipstick  Result Value Ref Range   Color, UA orange (A) yellow   Clarity, UA clear clear    Glucose, UA negative negative mg/dL   Bilirubin, UA negative negative   Ketones, POC UA negative negative mg/dL   Spec Grav, UA 11/24/20 5.993 - 1.025   Blood, UA trace-intact (A) negative   pH, UA 6.0 5.0 - 8.0   Protein Ur, POC negative negative mg/dL   Urobilinogen, UA 0.2 0.2 or 1.0  E.U./dL   Nitrite, UA Negative Negative   Leukocytes, UA Negative Negative     Assessment & Plan:  Olene FlossRicky R Mcmurphy is a 55 y.o. male . Hematuria, unspecified type - Plan: POCT urinalysis dipstick  - Trace blood on in office urinalysis, but will check micro for verification.  With tobacco use history would recommend urology eval if hematuria noted.  Asymptomatic at this time.  Maintenance of hydration discussed.  Differential includes myoglobinuria with his physical work.  RTC precautions.  Situational anxiety  -We will use alprazolam for sleep, the same.  Refilled.  Meds ordered this encounter  Medications  . ALPRAZolam (XANAX) 0.25 MG tablet    Sig: Take 0.5-1 tablets (0.125-0.25 mg total) by mouth daily as needed for anxiety. T    Dispense:  10 tablet    Refill:  0   Patient Instructions   Would like to recheck the urine with a microscopy, and that can be ordered/performed through the outpatient lab.  See location below.  If that test does show red blood cells I will refer you to a urologist.  Make sure to drink plenty of fluids throughout the day, especially when exercising.  Follow-up in the next 6 months for physical.  If you notice any blood in the urine or new symptoms, let me know right away.  Here are a few lab drawing stations for you to have your labwork performed:  [facility] [address], [address] [place], [place] [address]  [facility] [address] [place], [place] [address]     Hematuria, Adult Hematuria is blood in the urine. Blood may be visible in the urine, or it may be identified with a test. This condition can be caused by infections of the bladder, urethra, kidney, or  prostate. Other possible causes include:  Kidney stones.  Cancer of the urinary tract.  Too much calcium in the urine.  Conditions that are passed from parent to child (inherited conditions).  Exercise that requires a lot of energy. Infections can usually be treated with medicine, and a kidney stone usually will pass through your urine. If neither of these is the cause of your hematuria, more tests may be needed to identify the cause of your symptoms. It is very important to tell your health care provider about any blood in your urine, even if it is painless or the blood stops without treatment. Blood in the urine, when it happens and then stops and then happens again, can be a symptom of a very serious condition, including cancer. There is no pain in the initial stages of many urinary cancers. Follow these instructions at home: Medicines  Take over-the-counter and prescription medicines only as told by your health care provider.  If you were prescribed an antibiotic medicine, take it as told by your health care provider. Do not stop taking the antibiotic even if you start to feel better. Eating and drinking  Drink enough fluid to keep your urine pale yellow. It is recommended that you drink 3-4 quarts (2.8-3.8 L) a day. If you have been diagnosed with an infection, drinking cranberry juice in addition to large amounts of water is recommended.  Avoid caffeine, tea, and carbonated beverages. These tend to irritate the bladder.  Avoid alcohol because it may irritate the prostate (in males). General instructions  If you have been diagnosed with a kidney stone, follow your health care provider's instructions about straining your urine to catch the stone.  Empty your bladder often. Avoid holding urine  for long periods of time.  If you are male: ? After a bowel movement, wipe from front to back and use each piece of toilet paper only once. ? Empty your bladder before and after  sex.  Pay attention to any changes in your symptoms. Tell your health care provider about any changes or any new symptoms.  It is up to you to get the results of any tests. Ask your health care provider, or the department that is doing the test, when your results will be ready.  Keep all follow-up visits. This is important. Contact a health care provider if:  You develop back pain.  You have a fever or chills.  You have nausea or vomiting.  Your symptoms do not improve after 3 days.  Your symptoms get worse. Get help right away if:  You develop severe vomiting and are unable to take medicine without vomiting.  You develop severe pain in your back or abdomen even though you are taking medicine.  You pass a large amount of blood in your urine.  You pass blood clots in your urine.  You feel very weak or like you might faint.  You faint. Summary  Hematuria is blood in the urine. It has many possible causes.  It is very important that you tell your health care provider about any blood in your urine, even if it is painless or the blood stops without treatment.  Take over-the-counter and prescription medicines only as told by your health care provider.  Drink enough fluid to keep your urine pale yellow. This information is not intended to replace advice given to you by your health care provider. Make sure you discuss any questions you have with your health care provider. Document Revised: 04/17/2020 Document Reviewed: 04/17/2020 Elsevier Patient Education  2021 [employer].    If you have lab work done today you will be contacted with your lab results within the next 2 weeks.  If you have not heard from [place] then please contact us. The fastest way to get your results is to register for My Chart.   IF you received an x-ray today, you will receive an invoice from [facility]. Please contact [facility] Radiology at [phone] with questions or concerns regarding your  invoice.   IF you received labwork today, you will receive an invoice from [facility]. Please contact [facility] at [phone] with questions or concerns regarding your invoice.   Our billing staff will not be able to assist you with questions regarding bills from these companies.  You will be contacted with the lab results as soon as they are available. The fastest way to get your results is to activate your My Chart account. Instructions are located on the last page of this paperwork. If you have not heard from [place] regarding the results in 2 weeks, please contact this office.         Signed, Meredith Staggers, MD [facility]

## 2020-11-23 LAB — URINALYSIS, MICROSCOPIC ONLY
Bacteria, UA: NONE SEEN
Casts: NONE SEEN /lpf
Epithelial Cells (non renal): NONE SEEN /hpf (ref 0–10)
WBC, UA: NONE SEEN /hpf (ref 0–5)

## 2020-11-27 ENCOUNTER — Other Ambulatory Visit: Payer: Self-pay | Admitting: Family Medicine

## 2020-11-27 ENCOUNTER — Telehealth: Payer: Self-pay | Admitting: Family Medicine

## 2020-11-27 DIAGNOSIS — R319 Hematuria, unspecified: Secondary | ICD-10-CM

## 2020-11-27 NOTE — Telephone Encounter (Signed)
Patient called and would like his lab results ASAP - he may need to be out of work due to pain in his back.

## 2020-11-27 NOTE — Telephone Encounter (Signed)
    Patient called and would like his lab results ASAP - he may need to be out of work due to pain in his back.

## 2020-11-27 NOTE — Progress Notes (Signed)
Noted  Referral placed.

## 2020-11-27 NOTE — Telephone Encounter (Signed)
Labs reviewed, see note.  In regards to back pain, that was not discussed at his last visit.  If evaluation needed by [place], should schedule appointment or be seen by other medical provider if needed.  Thanks.

## 2020-11-28 NOTE — Telephone Encounter (Signed)
Patient went to the ED for the back pain yesterday.

## 2021-07-23 ENCOUNTER — Encounter: Payer: Self-pay | Admitting: Registered Nurse

## 2021-07-23 ENCOUNTER — Ambulatory Visit: Payer: BLUE CROSS/BLUE SHIELD | Admitting: Family Medicine

## 2021-07-23 ENCOUNTER — Other Ambulatory Visit: Payer: Self-pay

## 2021-07-23 ENCOUNTER — Ambulatory Visit: Payer: Self-pay | Admitting: Registered Nurse

## 2021-07-23 VITALS — BP 121/77 | HR 91 | Temp 97.9°F | Resp 18 | Ht 74.0 in | Wt 250.0 lb

## 2021-07-23 DIAGNOSIS — M791 Myalgia, unspecified site: Secondary | ICD-10-CM

## 2021-07-23 DIAGNOSIS — R051 Acute cough: Secondary | ICD-10-CM

## 2021-07-23 DIAGNOSIS — R5081 Fever presenting with conditions classified elsewhere: Secondary | ICD-10-CM

## 2021-07-23 LAB — POC COVID19 BINAXNOW: SARS Coronavirus 2 Ag: NEGATIVE

## 2021-07-23 LAB — POCT INFLUENZA A/B
Influenza A, POC: NEGATIVE
Influenza B, POC: NEGATIVE

## 2021-07-23 MED ORDER — BENZONATATE 100 MG PO CAPS: 100.0000 mg | ORAL_CAPSULE | Freq: Three times a day (TID) | ORAL | 0 refills | Status: DC | PRN

## 2021-07-23 MED ORDER — DM-GUAIFENESIN ER 30-600 MG PO TB12: 1.0000 | ORAL_TABLET | Freq: Two times a day (BID) | ORAL | 0 refills | Status: DC

## 2021-07-23 NOTE — Patient Instructions (Addendum)
Mr. Markuson -   Randie Heinz to see you  Take mucinex twice daily, tessalon as needed for cough  Recommend tylenol 1000mg  three times daily for fever and aches.   Make sure you are not taking doubles of anything - check drug ingredients of OTC medications like theraflu, etc.  Call me Monday if not getting better.  A gallon of fluids a day and 3 meals  Thank you  Rich    If you have lab work done today you will be contacted with your lab results within the next 2 weeks.  If you have not heard from Wednesday then please contact us. The fastest way to get your results is to register for My Chart.   IF you received an x-ray today, you will receive an invoice from [facility]. Please contact [facility]iology at [phone] with questions or concerns regarding your invoice.   IF you received labwork today, you will receive an invoice from [facility]. Please contact [facility] at [phone] with questions or concerns regarding your invoice.   Our billing staff will not be able to assist you with questions regarding bills from these companies.  You will be contacted with the lab results as soon as they are available. The fastest way to get your results is to activate your My Chart account. Instructions are located on the last page of this paperwork. If you have not heard from [phone] regarding the results in 2 weeks, please contact this office.

## 2021-07-23 NOTE — Progress Notes (Signed)
Established Patient Office Visit  Subjective:  Patient ID: Shawn Miller, male    DOB: Jan 20, 1966  Age: 55 y.o. MRN: [id number]  CC:  Chief Complaint  Patient presents with   Cough    Patient states he has been having some chills, coughing, sore throat,congestion, body aches, stomach aches, headache, diarrhea and just feeling bad since 07/21/2021. Patient he has only took cough drops and soup.    HPI GRIGOR LIPSCHUTZ presents for flu like symptoms.  Onset 07/21/21 with headaches, malaise, fatigue Worsened with cough, sore throat, congestion in nose, myalgias.  Some stomach aches and a little bit of diarrhea. No blood or mucus in stool  Taking OTC cough and cold relief, good effect.   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    Past Medical History:  Diagnosis Date   Anxiety    Headache     Past Surgical History:  Procedure Laterality Date   JOINT REPLACEMENT  2003   per patient neck-3 screws and 3 plate   posterior neck surgery      Family History  Problem Relation Age of Onset   Cancer Mother        breast   Dementia Father    Heart disease Father    Cancer Sister        thyroid   Headache Neg Hx     Social History   Socioeconomic History   Marital status: Married    Spouse name: Not on file   Number of children: Not on file   Years of education: Not on file   Highest education level: Not on file  Occupational History   Occupation: transportation  Tobacco Use   Smoking status: Every Day    Packs/day: 1.00    Years: 33.00    Pack years: 33.00    Types: Cigarettes   Smokeless tobacco: Never  Substance and Sexual Activity   Alcohol use: Yes   Drug use: No   Sexual activity: Not on file  Other Topics Concern   Not on file  Social History Narrative   Exercise walking 2 miles two times/week   Social Determinants of Health    Financial Resource Strain: Not on file  Food Insecurity: Not on file  Transportation Needs: Not on file  Physical Activity: Not on file  Stress: Not on file  Social Connections: Not on file  Intimate Partner Violence: Not on file    Outpatient Medications Prior to Visit  Medication Sig Dispense Refill   ALPRAZolam (XANAX) 0.25 MG tablet Take 0.5-1 tablets (0.125-0.25 mg total) by mouth daily as needed for anxiety. T 10 tablet 0   hydrOXYzine (ATARAX/VISTARIL) 25 MG tablet Take 0.5-1 tablets (12.5-25 mg total) by mouth at bedtime as needed for anxiety. (Patient not taking: Reported on 07/04/2019) 15 tablet 0   magic mouthwash w/lidocaine SOLN Take 5 mLs by mouth 4 (four) times daily as needed for mouth pain. Swish, gargle, and spit 5 cc every 6 hours as needed. (Patient not taking: Reported on 11/22/2020) 120 mL 1   sildenafil (VIAGRA) 50 MG tablet Take 0.5-1 tablets (25-50 mg total) by mouth daily as needed for erectile dysfunction. (Patient not taking: Reported on 07/04/2019) 6 tablet 3   tadalafil (CIALIS) 20 MG tablet Take 0.5-1 tablets (10-20 mg total) by mouth every other day as needed for erectile  dysfunction. (Patient not taking: Reported on 07/04/2019) 5 tablet 11   No facility-administered medications prior to visit.    Allergies  Allergen Reactions   Gadolinium Derivatives Cough    Dr. Mayford Knife s/w patient after patient  began coughing post injection. Patient unsure if he just got choked or if it was the contrast. No treatment expect for 20 minute observation. ry    ROS Review of Systems Per hpi   Objective:    Physical Exam Constitutional:      General: He is not in acute distress.    Appearance: Normal appearance. He is normal weight. He is not ill-appearing, toxic-appearing or diaphoretic.  Cardiovascular:     Rate and Rhythm: Normal rate and regular rhythm.     Heart sounds: Normal heart sounds. No murmur heard.   No friction rub. No gallop.  Pulmonary:      Effort: Pulmonary effort is normal. No respiratory distress.     Breath sounds: Normal breath sounds. No stridor. No wheezing, rhonchi or rales.  Chest:     Chest wall: No tenderness.  Neurological:     General: No focal deficit present.     Mental Status: He is alert and oriented to person, place, and time. Mental status is at baseline.  Psychiatric:        Mood and Affect: Mood normal.        Behavior: Behavior normal.        Thought Content: Thought content normal.        Judgment: Judgment normal.    BP 121/77   Pulse 91   Temp 97.9 F (36.6 C) (Temporal)   Resp 18   Ht 6\' 2"  (1.88 m)   Wt 250 lb (113.4 kg)   SpO2 99%   BMI 32.10 kg/m  Wt Readings from Last 3 Encounters:  07/23/21 250 lb (113.4 kg)  11/22/20 258 lb (117 kg)  07/04/19 252 lb (114.3 kg)     Health Maintenance Due  Topic Date Due   COVID-19 Vaccine (1) Never done   Pneumococcal Vaccine 59-33 Years old (1 - PCV) Never done   Zoster Vaccines- Shingrix (1 of 2) Never done   INFLUENZA VACCINE  Never done    There are no preventive care reminders to display for this patient.  Lab Results  Component Value Date   TSH 0.784 01/31/2019   Lab Results  Component Value Date   WBC 10.8 (H) 03/11/2018   HGB 13.0 03/11/2018   HCT 41.7 03/11/2018   MCV 81.1 03/11/2018   PLT 366 03/11/2018   Lab Results  Component Value Date   NA 142 01/31/2019   K 4.8 01/31/2019   CO2 23 01/31/2019   GLUCOSE 88 01/31/2019   BUN 11 01/31/2019   CREATININE 0.97 01/31/2019   BILITOT 0.4 01/31/2019   ALKPHOS 102 01/31/2019   AST 19 01/31/2019   ALT 28 01/31/2019   PROT 7.6 01/31/2019   ALBUMIN 4.9 01/31/2019   CALCIUM 10.2 01/31/2019   ANIONGAP 9 03/11/2018   Lab Results  Component Value Date   CHOL 215 (H) 01/31/2019   Lab Results  Component Value Date   HDL 43 01/31/2019   Lab Results  Component Value Date   LDLCALC 140 (H) 01/31/2019   Lab Results  Component Value Date   TRIG 162 (H) 01/31/2019    Lab Results  Component Value Date   CHOLHDL 5.0 01/31/2019   No results found for: HGBA1C    Assessment & Plan:  Problem List Items Addressed This Visit   None Visit Diagnoses     Myalgia    -  Primary   Relevant Orders   POCT Influenza A/B (Completed)   POC COVID-19 (Completed)   Fever in other diseases       Relevant Orders   POCT Influenza A/B (Completed)   POC COVID-19 (Completed)   Acute cough       Relevant Medications   dextromethorphan-guaiFENesin (MUCINEX DM) 30-600 MG 12hr tablet   benzonatate (TESSALON) 100 MG capsule       Meds ordered this encounter  Medications   dextromethorphan-guaiFENesin (MUCINEX DM) 30-600 MG 12hr tablet    Sig: Take 1 tablet by mouth 2 (two) times daily.    Dispense:  20 tablet    Refill:  0    Order Specific Question:   Supervising Provider    Answer:   Neva Seat, JEFFREY R [[id number]]   benzonatate (TESSALON) 100 MG capsule    Sig: Take 1 capsule (100 mg total) by mouth 3 (three) times daily as needed for cough.    Dispense:  20 capsule    Refill:  0    Order Specific Question:   Supervising Provider    Answer:   Neva Seat, JEFFREY R [[id number]]    Follow-up: Return if symptoms worsen or fail to improve.   PLAN Suspect viral illness other than covid Negative covid and flu poc testing. Mucinex, tylenol, tessalon as above Out of work through Sunday, return Monday if improved, call otherwise. Patient encouraged to call clinic with any questions, comments, or concerns.  Janeece Agee, NP

## 2021-12-09 ENCOUNTER — Encounter: Payer: Self-pay | Admitting: Registered Nurse

## 2021-12-09 ENCOUNTER — Ambulatory Visit (INDEPENDENT_AMBULATORY_CARE_PROVIDER_SITE_OTHER): Payer: Self-pay | Admitting: Registered Nurse

## 2021-12-09 VITALS — BP 111/63 | HR 60 | Temp 98.0°F | Resp 18 | Ht 74.0 in | Wt 260.2 lb

## 2021-12-09 DIAGNOSIS — R051 Acute cough: Secondary | ICD-10-CM

## 2021-12-09 DIAGNOSIS — R52 Pain, unspecified: Secondary | ICD-10-CM

## 2021-12-09 LAB — POCT INFLUENZA A/B
Influenza A, POC: NEGATIVE
Influenza B, POC: NEGATIVE

## 2021-12-09 LAB — POC COVID19 BINAXNOW: SARS Coronavirus 2 Ag: NEGATIVE

## 2021-12-09 MED ORDER — DOXYCYCLINE HYCLATE 100 MG PO TABS: 100.0000 mg | ORAL_TABLET | Freq: Two times a day (BID) | ORAL | 0 refills | Status: DC

## 2021-12-09 MED ORDER — PREDNISONE 10 MG (21) PO TBPK: ORAL_TABLET | ORAL | 0 refills | Status: DC

## 2021-12-09 MED ORDER — BENZONATATE 100 MG PO CAPS: 100.0000 mg | ORAL_CAPSULE | Freq: Three times a day (TID) | ORAL | 0 refills | Status: DC | PRN

## 2021-12-09 NOTE — Progress Notes (Signed)
? ?Acute Office Visit ? ?Subjective:  ? ? Patient ID: Shawn Miller, male    DOB: [birth year], 56 y.o.   MRN: [id number] ? ?Chief Complaint  ?Patient presents with  ? Cough  ?  Patient states since Sunday he has been having some cough , congestion and body aches. Patient states he has been taking some OTC medications but still not feeling better. 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 ? ?HPI ?Patient is in today for cough ? ?Onset since Sunday morning.  ?Congestion in chest and body aches. ?Sore throat, but this has since resolved. ?Has been taking OTC medications without relief. ?Drives trucks at night. Not feeling well enough to work safely.  ? ?Denies nvd, fevers, chills ?Some fatigue.  ? ?Took a home covid test - negative last night.  ? ?No sick contacts.  ? ?Outpatient Medications Prior to Visit  ?Medication Sig Dispense Refill  ? ALPRAZolam (XANAX) 0.25 MG tablet Take 0.5-1 tablets (0.125-0.25 mg total) by mouth daily as needed for anxiety. T (Patient not taking: Reported on 12/09/2021) 10 tablet 0  ? dextromethorphan-guaiFENesin (MUCINEX DM) 30-600 MG 12hr tablet Take 1 tablet by mouth 2 (two) times daily. (Patient not taking: Reported on 12/09/2021) 20 tablet 0  ? hydrOXYzine (ATARAX/VISTARIL) 25 MG tablet Take 0.5-1 tablets (12.5-25 mg total) by mouth at bedtime as needed for anxiety. (Patient not taking: Reported on 07/04/2019) 15 tablet 0  ? magic mouthwash w/lidocaine SOLN Take 5 mLs by mouth 4 (four) times daily as needed for mouth pain. Swish, gargle, and spit 5 cc every 6 hours as needed. (Patient not taking: Reported on 11/22/2020) 120 mL 1  ? sildenafil (VIAGRA) 50 MG tablet Take 0.5-1 tablets (25-50 mg total) by mouth daily as needed for erectile dysfunction. (Patient not taking: Reported on 07/04/2019) 6 tablet 3  ? tadalafil (CIALIS) 20 MG tablet Take 0.5-1 tablets (10-20 mg total) by mouth every other day as needed for erectile dysfunction. (Patient not taking: Reported on  07/04/2019) 5 tablet 11  ? benzonatate (TESSALON) 100 MG capsule Take 1 capsule (100 mg total) by mouth 3 (three) times daily as needed for cough. (Patient not taking: Reported on 12/09/2021) 20 capsule 0  ? ?No facility-administered medications prior to visit.  ? ? ?Review of Systems  ?Constitutional: Negative.   ?HENT: Negative.    ?Eyes: Negative.   ?Respiratory: Negative.    ?Cardiovascular: Negative.   ?Gastrointestinal: Negative.   ?Genitourinary: Negative.   ?Musculoskeletal: Negative.   ?Skin: Negative.   ?Neurological: Negative.   ?Psychiatric/Behavioral: Negative.    ?All other systems reviewed and are negative. ? ?   ?Objective:  ?  ?BP 111/63   Pulse 60   Temp 98 ?F (36.7 ?C) (Temporal)   Resp 18   Ht 6\' 2"  (1.88 m)   Wt 260 lb 3.2 oz (118 kg)   SpO2 98%   BMI 33.41 kg/m?  ?Physical Exam ?Constitutional:   ?   General: He is not in acute distress. ?   Appearance: Normal appearance. He is normal weight. He is not ill-appearing, toxic-appearing or diaphoretic.  ?Cardiovascular:  ?   Rate and Rhythm: Normal rate and regular rhythm.  ?   Heart sounds: Normal heart sounds. No murmur heard. ?  No friction rub. No gallop.  ?Pulmonary:  ?   Effort: Pulmonary effort is normal. No respiratory distress.  ?   Breath sounds: No stridor. Wheezing (throughout) present. No rhonchi or  rales.  ?Chest:  ?   Chest wall: No tenderness.  ?Neurological:  ?   General: No focal deficit present.  ?   Mental Status: He is alert and oriented to person, place, and time. Mental status is at baseline.  ?Psychiatric:     ?   Mood and Affect: Mood normal.     ?   Behavior: Behavior normal.     ?   Thought Content: Thought content normal.     ?   Judgment: Judgment normal.  ? ? ?Results for orders placed or performed in visit on 12/09/21  ?POCT Influenza A/B  ?Result Value Ref Range  ? Influenza A, POC Negative Negative  ? Influenza B, POC Negative Negative  ?POC COVID-19  ?Result Value Ref Range  ? SARS Coronavirus 2 Ag Negative  Negative  ? ? ? ?   ?Assessment & Plan:  ?1. Acute cough ?- POCT Influenza A/B ?- POC COVID-19 ?- predniSONE (STERAPRED UNI-PAK 21 TAB) 10 MG (21) TBPK tablet; Take per package instructions. Do not skip doses. Finish entire supply.  Dispense: 1 each; Refill: 0 ?- doxycycline (VIBRA-TABS) 100 MG tablet; Take 1 tablet (100 mg total) by mouth 2 (two) times daily.  Dispense: 20 tablet; Refill: 0 ?- benzonatate (TESSALON) 100 MG capsule; Take 1 capsule (100 mg total) by mouth 3 (three) times daily as needed for cough.  Dispense: 20 capsule; Refill: 0 ? ?2. Body aches ?- POCT Influenza A/B ?- POC COVID-19 ?- predniSONE (STERAPRED UNI-PAK 21 TAB) 10 MG (21) TBPK tablet; Take per package instructions. Do not skip doses. Finish entire supply.  Dispense: 1 each; Refill: 0 ?- doxycycline (VIBRA-TABS) 100 MG tablet; Take 1 tablet (100 mg total) by mouth 2 (two) times daily.  Dispense: 20 tablet; Refill: 0 ? ? ? ?Meds ordered this encounter  ?Medications  ? predniSONE (STERAPRED UNI-PAK 21 TAB) 10 MG (21) TBPK tablet  ?  Sig: Take per package instructions. Do not skip doses. Finish entire supply.  ?  Dispense:  1 each  ?  Refill:  0  ?  Order Specific Question:   Supervising Provider  ?  Answer:   Neva Seat, JEFFREY R [[id number]]  ? doxycycline (VIBRA-TABS) 100 MG tablet  ?  Sig: Take 1 tablet (100 mg total) by mouth 2 (two) times daily.  ?  Dispense:  20 tablet  ?  Refill:  0  ?  Order Specific Question:   Supervising Provider  ?  Answer:   Neva Seat, JEFFREY R [[id number]]  ? benzonatate (TESSALON) 100 MG capsule  ?  Sig: Take 1 capsule (100 mg total) by mouth 3 (three) times daily as needed for cough.  ?  Dispense:  20 capsule  ?  Refill:  0  ?  Order Specific Question:   Supervising Provider  ?  Answer:   Neva Seat, JEFFREY R [[id number]]  ? ? ?No follow-ups on file. ? ?PLAN ?Prednisone burst. Antitussives as above. Will give doxycycline given extensive adventitious lung sounds. Reviewed risks, benefits, and side effects, pt voices  understanding. ?Return if worsening or failing to improve. ?Patient encouraged to call clinic with any questions, comments, or concerns. ? ?Janeece Agee, NP ?

## 2021-12-09 NOTE — Patient Instructions (Addendum)
Mr. Kondracki - ? ?Great news - negative covid and flu testing. ? ?Will send antibiotics and prednisone. Tessalon for cough. Ok to continue over the counter meds if you'd like. ? ?If not feeling a lot better by Friday morning, call me ? ?Thanks, ? ?Rich  ? ? ? ?If you have lab work done today you will be contacted with your lab results within the next 2 weeks.  If you have not heard from [place] then please contact us. The fastest way to get your results is to register for My Chart. ? ? ?IF you received an x-ray today, you will receive an invoice from [facility]. Please contact [facility] at [phone] with questions or concerns regarding your invoice.  ? ?IF you received labwork today, you will receive an invoice from [facility]. Please contact [facility] at [phone] with questions or concerns regarding your invoice.  ? ?Our billing staff will not be able to assist you with questions regarding bills from these companies. ? ?You will be contacted with the lab results as soon as they are available. The fastest way to get your results is to activate your My Chart account. Instructions are located on the last page of this paperwork. If you have not heard from [place] regarding the results in 2 weeks, please contact this office. ?  ? ? ?

## 2022-03-17 ENCOUNTER — Encounter: Payer: Self-pay | Admitting: Family

## 2022-03-17 ENCOUNTER — Ambulatory Visit: Payer: Self-pay | Admitting: Family

## 2022-03-17 VITALS — BP 130/74 | HR 82 | Temp 97.7°F | Resp 16 | Ht 74.0 in | Wt 253.0 lb

## 2022-03-17 DIAGNOSIS — S76311A Strain of muscle, fascia and tendon of the posterior muscle group at thigh level, right thigh, initial encounter: Secondary | ICD-10-CM

## 2022-03-17 MED ORDER — IBUPROFEN 800 MG PO TABS: 800.0000 mg | ORAL_TABLET | Freq: Three times a day (TID) | ORAL | 0 refills | Status: DC | PRN

## 2022-03-17 NOTE — Progress Notes (Signed)
Patient ID: Shawn Miller, male    DOB: 1966/01/13, 56 y.o.   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 Reports sharp pain at the time, but was able to finish his work day. 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 Denies continuous pain or soreness with any movement, just when he stands up or turns his leg a certain way. He reports applying ice for 30 minutes several times Friday night and Saturday, then switched to heat and using IcyHot. Did not take any OTC pain meds.  Assessment & Plan:  1. Strain of right hamstring muscle, initial encounter Advised pt to rest and not do any heavy lifting, pulling or pushing that would aggravate his injury. Pt reports he has to continue to work. Advised on continued use of heat up to 30 min. tid, use ice if re-injured after working for same amount of time. Can also apply OTC analgesic creams/patches. Perform gentle hamstring stretches as able, may take up to 1-2 mos for complete healing. Can refer to PT for therapy if desired. Sending Ibuprofen, advised on use & SE.  - ibuprofen (ADVIL) 800 MG tablet; Take 1 tablet (800 mg total) by mouth every 8 (eight) hours as needed for moderate pain or cramping. Take AFTER eating.  Dispense: 30 tablet; Refill: 0   Subjective:    Outpatient Medications Prior to Visit  Medication Sig Dispense Refill   ALPRAZolam (XANAX) 0.25 MG tablet Take 0.5-1 tablets (0.125-0.25 mg total) by mouth daily as needed for anxiety. T 10 tablet 0   benzonatate (TESSALON) 100 MG capsule Take 1 capsule (100 mg total) by mouth 3 (three) times daily as needed for  cough. 20 capsule 0   dextromethorphan-guaiFENesin (MUCINEX DM) 30-600 MG 12hr tablet Take 1 tablet by mouth 2 (two) times daily. 20 tablet 0   doxycycline (VIBRA-TABS) 100 MG tablet Take 1 tablet (100 mg total) by mouth 2 (two) times daily. 20 tablet 0   predniSONE (STERAPRED UNI-PAK 21 TAB) 10 MG (21) TBPK tablet Take per package instructions. Do not skip doses. Finish entire supply. 1 each 0   hydrOXYzine (ATARAX/VISTARIL) 25 MG tablet Take 0.5-1 tablets (12.5-25 mg total) by mouth at bedtime as needed for anxiety. (Patient not taking: Reported on 03/17/2022) 15 tablet 0   sildenafil (VIAGRA) 50 MG tablet Take 0.5-1 tablets (25-50 mg total) by mouth daily as needed for erectile dysfunction. (Patient not taking: Reported on 03/17/2022) 6 tablet 3   tadalafil (CIALIS) 20 MG tablet Take 0.5-1 tablets (10-20 mg total) by mouth every other day as needed for erectile dysfunction. (Patient not taking: Reported on 03/17/2022) 5 tablet 11   magic mouthwash w/lidocaine SOLN Take 5 mLs by mouth 4 (four) times daily as needed for mouth pain. Swish, gargle, and spit 5 cc every 6 hours as needed. (Patient not taking: Reported on 03/17/2022) 120 mL 1   No facility-administered medications prior to visit.   Past Medical History:  Diagnosis Date   Anxiety    Headache    Past Surgical History:  Procedure Laterality Date   JOINT REPLACEMENT  2003   per patient neck-3 screws and 3 plate   posterior neck surgery     Allergies  Allergen Reactions   Gadolinium Derivatives Cough    Dr. Mayford Knife s/w patient after patient  began coughing post injection. Patient unsure if he just got choked or if it was the contrast. No treatment expect for 20 minute observation. ry      Objective:    Physical Exam Vitals and nursing note reviewed.  Constitutional:      General: He is not in acute distress.    Appearance: Normal appearance.  HENT:     Head: Normocephalic.  Cardiovascular:     Rate and Rhythm: Normal rate  and regular rhythm.  Pulmonary:     Effort: Pulmonary effort is normal.     Breath sounds: Normal breath sounds.  Musculoskeletal:        General: Normal range of motion.     Cervical back: Normal range of motion.     Right upper leg: Tenderness (see diagram) present. No swelling.       Legs:  Skin:    General: Skin is warm and dry.  Neurological:     Mental Status: He is alert and oriented to person, place, and time.  Psychiatric:        Mood and Affect: Mood normal.    BP 130/74   Pulse 82   Temp 97.7 F (36.5 C) (Temporal)   Resp 16   Ht 6\' 2"  (1.88 m)   Wt 253 lb (114.8 kg)   SpO2 97%   BMI 32.48 kg/m  Wt Readings from Last 3 Encounters:  03/17/22 253 lb (114.8 kg)  12/09/21 260 lb 3.2 oz (118 kg)  07/23/21 250 lb (113.4 kg)       07/25/21, NP

## 2022-03-17 NOTE — Patient Instructions (Addendum)
It was very nice to see you today!   Continue to apply heat to the back of your leg for 20-79minutes 3 times per day. Apply ice if it is sore after work, then apply the heat again. Continue to apply IcyHot, Tiger balm, Biofreeze, etc to the area, rub in well, then can apply heat on top. Also look for pain relief patches with the above ingredients or can look for Lidocaine, which is actual pain relief medicine.  OK to take Ibuprofen up to 3 pills 3 times per day or 2 generic Aleve twice a day to help with the swelling and pain.     PLEASE NOTE:  If you had any lab tests please let us know if you have not heard back within a few days. You may see your results on MyChart before we have a chance to review them but we will give you a call once they are reviewed by [place]. If we ordered any referrals today, please let us know if you have not heard from their office within the next week.

## 2022-03-19 ENCOUNTER — Telehealth: Payer: Self-pay | Admitting: Family

## 2022-03-19 NOTE — Telephone Encounter (Signed)
Patient states he has tried to work the last 3 days.  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   He is requesting a letter to excuse him from work this Friday 7/21 and Saturday 7/22.  Patient is going to sign up for mychart.  Letter can be sent through mychart.  Please give patient a follow up call once approved.

## 2022-03-19 NOTE — Telephone Encounter (Signed)
no, just seen - ok to write letter stating out of work due to hamstring injury

## 2022-03-20 ENCOUNTER — Encounter: Payer: Self-pay | Admitting: Family

## 2022-03-20 NOTE — Telephone Encounter (Signed)
ERROR

## 2022-03-20 NOTE — Telephone Encounter (Signed)
Pt states he would like the letter for work to be sent to his MyChart. If he cannot get his MyChart set up, he will stop by and wait for it to be printed.

## 2022-03-20 NOTE — Telephone Encounter (Signed)
Lvm for pt,Letter left at front desk.

## 2022-03-20 NOTE — Telephone Encounter (Signed)
send note, please

## 2022-04-07 ENCOUNTER — Telehealth: Payer: Self-pay | Admitting: Registered Nurse

## 2022-04-07 NOTE — Telephone Encounter (Signed)
Left patient vm to call [facility] office back to update insurance information for DOS 12/09/21.

## 2022-04-08 NOTE — Telephone Encounter (Signed)
Left vm for patient to return call to give [place] updated insurance info if he has it

## 2023-01-18 ENCOUNTER — Encounter: Payer: Self-pay | Admitting: Family Medicine

## 2023-01-18 ENCOUNTER — Ambulatory Visit (INDEPENDENT_AMBULATORY_CARE_PROVIDER_SITE_OTHER): Payer: BLUE CROSS/BLUE SHIELD | Admitting: Family Medicine

## 2023-01-18 VITALS — BP 124/62 | HR 80 | Temp 98.0°F | Ht 74.0 in | Wt 255.8 lb

## 2023-01-18 DIAGNOSIS — M7711 Lateral epicondylitis, right elbow: Secondary | ICD-10-CM | POA: Diagnosis not present

## 2023-01-18 DIAGNOSIS — M791 Myalgia, unspecified site: Secondary | ICD-10-CM

## 2023-01-18 DIAGNOSIS — R202 Paresthesia of skin: Secondary | ICD-10-CM | POA: Diagnosis not present

## 2023-01-18 DIAGNOSIS — M79601 Pain in right arm: Secondary | ICD-10-CM | POA: Diagnosis not present

## 2023-01-18 LAB — CK: Total CK: 157 U/L (ref 7–232)

## 2023-01-18 MED ORDER — MELOXICAM 7.5 MG PO TABS: 7.5000 mg | ORAL_TABLET | Freq: Every day | ORAL | 0 refills | Status: DC

## 2023-01-18 NOTE — Patient Instructions (Addendum)
Do the best you can to limit repetitive activity with your right arm, but start antiinflammatory once per day (meloxicam), do not take over the counter meds with this. If any concerns on lab I will let you know. Recheck in 2 weeks and then can decide if ortho eval needed. Elbow pain is likley tennis elbow, but other areas would be unrelated.  Return to the clinic or go to the nearest emergency room if any of your symptoms worsen or new symptoms occur.  Tennis Elbow  Tennis elbow (lateral epicondylitis) is inflammation of tendons in your outer forearm, near your elbow. Tendons are tissues that connect muscle to bone. When you have tennis elbow, inflammation affects the tendons that you use to bend your wrist and move your hand up. Inflammation occurs in the lower part of the upper arm bone (humerus), where the tendons connect to the bone (lateral epicondyle). Tennis elbow often affects people who play tennis, but anyone may get the condition from repeatedly extending the wrist or turning the forearm. What are the causes? This condition is usually caused by repeatedly extending the wrist, turning the forearm, and using the hands. It can result from sports or work that requires repetitive forearm movements. In some cases, it may be caused by a sudden injury. What increases the risk? You are more likely to develop tennis elbow if you play tennis or another racket sport. You also have a higher risk if you frequently use your hands for work. Besides people who play tennis, others at greater risk include: People who use computers. Holiday representative workers. People who work in [employer]. Musicians. Cooks. Cashiers. What are the signs or symptoms? Symptoms of this condition include: Pain and tenderness in the forearm and the outer part of the elbow. Pain may be felt only when using the arm, or it may be there all the time. A burning feeling that starts in the elbow and spreads down the forearm. A weak grip in  the hand. How is this diagnosed? This condition is diagnosed based on your symptoms, your medical history, and a physical exam. You may also have X-rays or an MRI to: Confirm the diagnosis. Look for other issues. Check for tears in the ligaments, muscles, or tendons. How is this treated? Resting and icing your arm is often the first treatment. Your health care provider may also recommend: Medicines to reduce pain and inflammation. These may be in the form of a pill, topical gels, or shots of a steroid medicine (cortisone). An elbow strap to reduce stress on the area. Physical therapy. This may include massage or exercises or both. An elbow brace to restrict the movements that cause symptoms. If these treatments do not help relieve your symptoms, your health care provider may recommend surgery to remove damaged muscle and reattach healthy muscle to bone. Follow these instructions at home: If you have a brace or strap: Wear the brace or strap as told by your health care provider. Remove it only as told by your health care provider. Check the skin around the brace or strap every day. Tell your health care provider about any concerns. Loosen the brace if your fingers tingle, become numb, or turn cold and blue. Keep the brace clean. If the brace or strap is not waterproof: Do not let it get wet. Cover it with a watertight covering when you take a bath or a shower. Managing pain, stiffness, and swelling  If directed, put ice on the injured area. To do this: If  you have a removable brace or strap, remove it as told by your health care provider. Put ice in a plastic bag. Place a towel between your skin and the bag. Leave the ice on for 20 minutes, 2-3 times a day. Remove the ice if your skin turns bright red. This is very important. If you cannot feel pain, heat, or cold, you have a greater risk of damage to the area. Move your fingers often to reduce stiffness and swelling. Activity Rest  your elbow and wrist and avoid activities that cause symptoms as told by your health care provider. Do physical therapy exercises as told by your health care provider. If you lift an object, lift it with your palm facing up. This reduces stress on your elbow. Lifestyle If your tennis elbow is caused by sports, check your equipment and make sure that: You use it correctly. It is good match for you. If your tennis elbow is caused by work or computer use, take frequent breaks to stretch your arm. Talk with your employer about ways to manage your condition at work. General instructions Take over-the-counter and prescription medicines only as told by your health care provider. Do not use any products that contain nicotine or tobacco. These products include cigarettes, chewing tobacco, and vaping devices, such as e-cigarettes. If you need help quitting, ask your health care provider. Keep all follow-up visits. This is important. How is this prevented? Before and after activity: Warm up and stretch before being active. Cool down and stretch after being active. Give your body time to rest between periods of activity. During activity: Make sure to use equipment that fits you. If you play tennis, put power in your stroke with your lower body. Avoid using your arm only. Maintain physical fitness, including: Strength. Flexibility. Endurance. Do exercises to strengthen the forearm muscles. Contact a health care provider if: You have pain that gets worse or does not get better with treatment. You have numbness or weakness in your forearm, hand, or fingers. Get help right away if: Your pain is severe. You cannot move your wrist. Summary Tennis elbow (lateral epicondylitis) is inflammation of tendons in your outer forearm, near your elbow. Common symptoms include pain and tenderness in your forearm and the outer part of your elbow. This condition is usually caused by repeatedly extending your  wrist, turning your forearm, and using your hands. The first treatment is often resting and icing your arm to relieve symptoms. Further treatment may include taking medicine, getting physical therapy, wearing a brace or strap, or having surgery. This information is not intended to replace advice given to you by your health care provider. Make sure you discuss any questions you have with your health care provider. Document Revised: 02/27/2020 Document Reviewed: 02/27/2020 Elsevier Patient Education  2023 [employer].

## 2023-01-18 NOTE — Addendum Note (Signed)
Addended by: Meredith Staggers R on: 01/18/2023 10:13 AM   Modules accepted: Orders
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
## 2023-01-21 ENCOUNTER — Telehealth: Payer: Self-pay | Admitting: Family Medicine

## 2023-01-21 NOTE — Telephone Encounter (Signed)
Pt called in asking for lab results, please advise  

## 2023-01-21 NOTE — Telephone Encounter (Signed)
Pt notes about the same at this time, pt will continue to monitor any further instructions?

## 2023-01-21 NOTE — Telephone Encounter (Signed)
Continue to try to minimize use of that arm, muscle enzyme test did not show rhabdomyolysis.  If any worsening symptoms should be seen sooner.  Otherwise keep follow-up appointment as planned June 3rd

## 2023-01-22 NOTE — Telephone Encounter (Signed)
LM to call back.

## 2023-01-26 NOTE — Telephone Encounter (Signed)
Left vm to call office

## 2023-01-26 NOTE — Telephone Encounter (Signed)
Noted  

## 2023-01-26 NOTE — Telephone Encounter (Signed)
Pt reports same issues. Pt was advise to call if anything changes.

## 2023-02-01 ENCOUNTER — Ambulatory Visit (INDEPENDENT_AMBULATORY_CARE_PROVIDER_SITE_OTHER): Payer: BLUE CROSS/BLUE SHIELD | Admitting: Family Medicine

## 2023-02-01 ENCOUNTER — Encounter: Payer: Self-pay | Admitting: Family Medicine

## 2023-02-01 ENCOUNTER — Other Ambulatory Visit: Payer: Self-pay

## 2023-02-01 VITALS — BP 120/70 | HR 84 | Wt 252.6 lb

## 2023-02-01 DIAGNOSIS — M79601 Pain in right arm: Secondary | ICD-10-CM

## 2023-02-01 DIAGNOSIS — F418 Other specified anxiety disorders: Secondary | ICD-10-CM

## 2023-02-01 DIAGNOSIS — R252 Cramp and spasm: Secondary | ICD-10-CM | POA: Diagnosis not present

## 2023-02-01 LAB — BASIC METABOLIC PANEL
BUN: 12 mg/dL (ref 6–23)
CO2: 29 mEq/L (ref 19–32)
Calcium: 9.8 mg/dL (ref 8.4–10.5)
Chloride: 104 mEq/L (ref 96–112)
Creatinine, Ser: 1 mg/dL (ref 0.40–1.50)
GFR: 82.62 mL/min (ref >60.00)
Glucose, Bld: 95 mg/dL (ref 70–99)
Potassium: 4.7 mEq/L (ref 3.5–5.1)
Sodium: 142 mEq/L (ref 135–145)

## 2023-02-01 NOTE — Progress Notes (Signed)
Subjective:  Patient ID: Shawn Miller, male    DOB: 1966-01-07  Age: 57 y.o. MRN: [id number]  CC:  Chief Complaint  Patient presents with   Follow-up    2 week follow up. He states he feels about the same and the medication has not helped much.    HPI Shawn Miller presents for   Right arm pain Follow-up May 20 visit.   Intermittent dysesthesias into the fingers and palm.  Prior neck surgery, but no neck pain.  Prior wrist surgery including reported arterial intervention.  Had not seen Ortho or neurosurgery recently.  Slight improvement with use of compression sleeve.   Thought to be overuse component at that time, including component of lateral epicondylosis.  CPK was normal, no sign of rhabdo. Recommended activity modification, limiting repetitive activity with that right arm is much as possible and started meloxicam last visit.  Feels about the same. Unable to change his activity, only minimal soreness at work, notices more afterward - bicep/tricep. No further elbow pain, forearm pain. No further numbness/tingling in fingers. Mobic daily.   Has noticed some cramping in both feet - at end of workday - past few weeks. Drinking water during day and pedialyte. Notices cramps in feet usually after workdays only - past couple of weeks. Improves with stretching.   History There are no problems to display for this patient.  Past Medical History:  Diagnosis Date   Anxiety    Headache    Past Surgical History:  Procedure Laterality Date   JOINT REPLACEMENT  2003   per patient neck-3 screws and 3 plate   posterior neck surgery     Allergies  Allergen Reactions   Gadolinium Derivatives Cough    Dr. Mayford Knife s/w patient after patient  began coughing post injection. Patient unsure if he just got choked or if it was the contrast. No treatment expect for 20 minute observation. ry   Prior to Admission medications   Medication Sig Start Date End Date Taking? Authorizing Provider   ALPRAZolam Prudy Feeler) 0.25 MG tablet Take 0.5-1 tablets (0.125-0.25 mg total) by mouth daily as needed for anxiety. T 11/22/20  Yes Shade Flood, MD  ibuprofen (ADVIL) 800 MG tablet Take 1 tablet (800 mg total) by mouth every 8 (eight) hours as needed for moderate pain or cramping. Take AFTER eating. 03/17/22  Yes Hudnell, Judeth Cornfield, NP  meloxicam (MOBIC) 7.5 MG tablet Take 1 tablet (7.5 mg total) by mouth daily. 01/18/23  Yes Shade Flood, MD   Social History   Socioeconomic History   Marital status: Married    Spouse name: Not on file   Number of children: Not on file   Years of education: Not on file   Highest education level: Not on file  Occupational History   Occupation: transportation  Tobacco Use   Smoking status: Every Day    Packs/day: 1.00    Years: 33.00    Additional pack years: 0.00    Total pack years: 33.00    Types: Cigarettes   Smokeless tobacco: Never  Substance and Sexual Activity   Alcohol use: Yes   Drug use: No   Sexual activity: Not on file  Other Topics Concern   Not on file  Social History Narrative   Exercise walking 2 miles two times/week   Social Determinants of Health   Financial Resource Strain: Not on file  Food Insecurity: Not on file  Transportation Needs: Not on file  Physical Activity: Not  on file  Stress: Not on file  Social Connections: Not on file  Intimate Partner Violence: Not on file    Review of Systems Per HPI.   Objective:   Vitals:   02/01/23 1328  BP: 120/70  Pulse: 84  SpO2: 98%  Weight: 252 lb 9.6 oz (114.6 kg)     Physical Exam Vitals reviewed.  Constitutional:      General: He is not in acute distress.    Appearance: Normal appearance. He is well-developed.  HENT:     Head: Normocephalic and atraumatic.  Cardiovascular:     Rate and Rhythm: Normal rate.  Pulmonary:     Effort: Pulmonary effort is normal.  Musculoskeletal:     Comments: Right upper extremity, discomfort over the mid bicep, and  tricep without defect appreciated.  Elbow including epicondyles nontender.  Forearm nontender.  Pain-free wrist, hand range of motion.  Left upper extremity pain-free on palpation and with range of motion  Lower extremities, foot, ankle no bony tenderness, skin intact, no erythema, no swelling.   Neurological:     Mental Status: He is alert and oriented to person, place, and time.  Psychiatric:        Mood and Affect: Mood normal.        Assessment & Plan:  Shawn Miller is a 57 y.o. male . Cramping of feet - Plan: Basic metabolic panel  -New concern, suspect muscle fatigue with his physical work.  Stretches, range of motion throughout today recommended, will check electrolytes but unlikely cause.  RTC precautions.  Handout given.  Right arm pain - Plan: Ambulatory referral to Orthopedic Surgery  -Previous elbow symptoms and hand dysesthesias have improved with meloxicam but persistent bicep/tricep pain.  Still suspect overuse component but he is unable to decrease activity at this time.  May need to take time off work, possible physical therapy, but will refer to orthopedics to evaluate further.  No orders of the defined types were placed in this encounter.  Patient Instructions  I am glad to hear that the elbow and hand symptoms are improved but I will refer you to orthopedics to talk about the bicep and tricep or upper arm pain.  I am checking some electrolytes for the foot cramps, but try to do stretches, range of motion throughout the day to see if that helps.  Stretches after work and before bedtime may also help.  Follow-up if that worsens. Return to the clinic or go to the nearest emergency room if any of your symptoms worsen or new symptoms occur.   Leg Cramps Leg cramps occur when one or more muscles tighten and a person has no control over it (involuntary muscle contraction). Muscle cramps are most common in the calf muscles of the leg. They can occur during exercise or at  rest. Leg cramps are painful, and they may last for a few seconds to a few minutes. Cramps may return several times before they finally stop. Usually, leg cramps are not caused by a serious medical problem. In many cases, the cause is not known. Some common causes include: Excessive physical effort (overexertion), such as during intense exercise. Doing the same motion over and over. Staying in a certain position for a long period of time. Improper preparation, form, or technique while doing a sport or an activity. Dehydration. Injury. Side effects of certain medicines. Abnormally low levels of minerals in your blood (electrolytes), especially potassium and calcium. This could result from: Pregnancy. Taking diuretic medicines.  Follow these instructions at home: Eating and drinking Drink enough fluid to keep your urine pale yellow. Staying hydrated may help prevent cramps. Eat a healthy diet that includes plenty of nutrients to help your muscles function. A healthy diet includes fruits and vegetables, lean protein, whole grains, and low-fat or nonfat dairy products. Managing pain, stiffness, and swelling     Try massaging, stretching, and relaxing the affected muscle. Do this for several minutes at a time. If directed, put ice on areas that are sore or painful after a cramp. To do this: Put ice in a plastic bag. Place a towel between your skin and the bag. Leave the ice on for 20 minutes, 2-3 times a day. Remove the ice if your skin turns bright red. This is very important. If you cannot feel pain, heat, or cold, you have a greater risk of damage to the area. If directed, apply heat to muscles that are tense or tight. Do this before you exercise, or as often as told by your health care provider. Use the heat source that your health care provider recommends, such as a moist heat pack or a heating pad. To do this: Place a towel between your skin and the heat source. Leave the heat on for  20-30 minutes. Remove the heat if your skin turns bright red. This is especially important if you are unable to feel pain, heat, or cold. You may have a greater risk of getting burned. Try taking hot showers or baths to help relax tight muscles. General instructions If you are having frequent leg cramps, avoid intense exercise for several days. Take over-the-counter and prescription medicines only as told by your health care provider. Keep all follow-up visits. This is important. Contact a health care provider if: Your leg cramps get more severe or more frequent, or they do not improve over time. Your foot becomes cold, numb, or blue. Summary Muscle cramps can develop in any muscle, but the most common place is in the calf muscles of the leg. Leg cramps are painful, and they may last for a few seconds to a few minutes. Usually, leg cramps are not caused by a serious medical problem. Often, the cause is not known. Stay hydrated, and take over-the-counter and prescription medicines only as told by your health care provider. This information is not intended to replace advice given to you by your health care provider. Make sure you discuss any questions you have with your health care provider. Document Revised: 01/03/2020 Document Reviewed: 01/03/2020 Elsevier Patient Education  2024 [employer]     Signed,   Meredith Staggers, MD [facility], [facility] 02/01/23 2:12 PM

## 2023-02-01 NOTE — Patient Instructions (Signed)
I am glad to hear that the elbow and hand symptoms are improved but I will refer you to orthopedics to talk about the bicep and tricep or upper arm pain.  I am checking some electrolytes for the foot cramps, but try to do stretches, range of motion throughout the day to see if that helps.  Stretches after work and before bedtime may also help.  Follow-up if that worsens. Return to the clinic or go to the nearest emergency room if any of your symptoms worsen or new symptoms occur.   Leg Cramps Leg cramps occur when one or more muscles tighten and a person has no control over it (involuntary muscle contraction). Muscle cramps are most common in the calf muscles of the leg. They can occur during exercise or at rest. Leg cramps are painful, and they may last for a few seconds to a few minutes. Cramps may return several times before they finally stop. Usually, leg cramps are not caused by a serious medical problem. In many cases, the cause is not known. Some common causes include: Excessive physical effort (overexertion), such as during intense exercise. Doing the same motion over and over. Staying in a certain position for a long period of time. Improper preparation, form, or technique while doing a sport or an activity. Dehydration. Injury. Side effects of certain medicines. Abnormally low levels of minerals in your blood (electrolytes), especially potassium and calcium. This could result from: Pregnancy. Taking diuretic medicines. Follow these instructions at home: Eating and drinking Drink enough fluid to keep your urine pale yellow. Staying hydrated may help prevent cramps. Eat a healthy diet that includes plenty of nutrients to help your muscles function. A healthy diet includes fruits and vegetables, lean protein, whole grains, and low-fat or nonfat dairy products. Managing pain, stiffness, and swelling     Try massaging, stretching, and relaxing the affected muscle. Do this for several  minutes at a time. If directed, put ice on areas that are sore or painful after a cramp. To do this: Put ice in a plastic bag. Place a towel between your skin and the bag. Leave the ice on for 20 minutes, 2-3 times a day. Remove the ice if your skin turns bright red. This is very important. If you cannot feel pain, heat, or cold, you have a greater risk of damage to the area. If directed, apply heat to muscles that are tense or tight. Do this before you exercise, or as often as told by your health care provider. Use the heat source that your health care provider recommends, such as a moist heat pack or a heating pad. To do this: Place a towel between your skin and the heat source. Leave the heat on for 20-30 minutes. Remove the heat if your skin turns bright red. This is especially important if you are unable to feel pain, heat, or cold. You may have a greater risk of getting burned. Try taking hot showers or baths to help relax tight muscles. General instructions If you are having frequent leg cramps, avoid intense exercise for several days. Take over-the-counter and prescription medicines only as told by your health care provider. Keep all follow-up visits. This is important. Contact a health care provider if: Your leg cramps get more severe or more frequent, or they do not improve over time. Your foot becomes cold, numb, or blue. Summary Muscle cramps can develop in any muscle, but the most common place is in the calf muscles of the leg.  Leg cramps are painful, and they may last for a few seconds to a few minutes. Usually, leg cramps are not caused by a serious medical problem. Often, the cause is not known. Stay hydrated, and take over-the-counter and prescription medicines only as told by your health care provider. This information is not intended to replace advice given to you by your health care provider. Make sure you discuss any questions you have with your health care  provider. Document Revised: 01/03/2020 Document Reviewed: 01/03/2020 Elsevier Patient Education  2024 [employer].

## 2023-02-02 ENCOUNTER — Telehealth: Payer: Self-pay

## 2023-02-02 NOTE — Telephone Encounter (Signed)
Pt aware of lab results 

## 2023-02-02 NOTE — Telephone Encounter (Signed)
-----   Message from Shade Flood, MD sent at 02/02/2023  1:32 PM EDT ----- Lab letter.    Electrolytes were normal.  Let me know if there are questions.  Dr. Neva Seat

## 2023-02-22 ENCOUNTER — Other Ambulatory Visit: Payer: Self-pay | Admitting: Family Medicine

## 2023-02-22 DIAGNOSIS — M791 Myalgia, unspecified site: Secondary | ICD-10-CM

## 2023-02-22 DIAGNOSIS — M79601 Pain in right arm: Secondary | ICD-10-CM

## 2023-02-22 NOTE — Telephone Encounter (Signed)
Pt states he is still taking this medication for right arm pain  and would like a refill ?

## 2023-02-22 NOTE — Telephone Encounter (Signed)
It appears that the orthopedic office has tried calling him multiple times and unable to schedule appointment.  Referral was closed.  Please have patient contact orthopedic office at [phone] extension [phone].  I will temporarily refill medication as long as he can schedule that follow-up appointment.  Please call and make sure he received this information and let me know if there are questions with this plan.

## 2023-02-23 NOTE — Telephone Encounter (Signed)
Called patient and no answer LM to have him call back so can deliver the message about phone number and Dr Haze Justin plan at this time

## 2023-04-13 ENCOUNTER — Other Ambulatory Visit: Payer: Self-pay | Admitting: Family Medicine

## 2023-04-13 DIAGNOSIS — M791 Myalgia, unspecified site: Secondary | ICD-10-CM

## 2023-04-13 DIAGNOSIS — M79601 Pain in right arm: Secondary | ICD-10-CM

## 2023-05-15 ENCOUNTER — Other Ambulatory Visit: Payer: Self-pay | Admitting: Family Medicine

## 2023-05-15 DIAGNOSIS — M791 Myalgia, unspecified site: Secondary | ICD-10-CM

## 2023-05-15 DIAGNOSIS — M79601 Pain in right arm: Secondary | ICD-10-CM

## 2023-06-21 ENCOUNTER — Other Ambulatory Visit: Payer: Self-pay | Admitting: Family Medicine

## 2023-06-21 DIAGNOSIS — M791 Myalgia, unspecified site: Secondary | ICD-10-CM

## 2023-06-21 DIAGNOSIS — M79601 Pain in right arm: Secondary | ICD-10-CM

## 2023-06-22 NOTE — Telephone Encounter (Signed)
Meloxicam should be short-term medication.  Please schedule follow-up visit to discuss reasons for use and then can decide if continued intermittent use needed or follow-up with Ortho if needed.

## 2023-08-29 ENCOUNTER — Other Ambulatory Visit: Payer: Self-pay | Admitting: Family Medicine

## 2023-08-29 DIAGNOSIS — M79601 Pain in right arm: Secondary | ICD-10-CM

## 2023-08-29 DIAGNOSIS — M791 Myalgia, unspecified site: Secondary | ICD-10-CM

## 2023-08-30 NOTE — Telephone Encounter (Signed)
Requested Prescriptions   Pending Prescriptions Disp Refills   meloxicam (MOBIC) 7.5 MG tablet [Pharmacy Med Name: MELOXICAM 7.5MG  TABLETS] 30 tablet 0    Sig: TAKE 1 TABLET(7.5 MG) BY MOUTH DAILY     Date of patient request: 08/30/2023 Last office visit: 02/01/2023 Upcoming visit: Visit date not found Date of last refill: 05/17/2023 Last refill amount: 30

## 2023-08-31 NOTE — Telephone Encounter (Signed)
 See message on October 22.  Meloxicam should be short-term medication.  Follow-up appointment requested.  Has not been seen since June.  Refill request denied for now.

## 2023-12-09 ENCOUNTER — Ambulatory Visit: Admitting: Student in an Organized Health Care Education/Training Program

## 2023-12-16 ENCOUNTER — Ambulatory Visit: Admitting: Family Medicine

## 2023-12-16 VITALS — BP 128/78 | HR 101 | Temp 98.0°F | Ht 74.0 in | Wt 246.2 lb

## 2023-12-16 DIAGNOSIS — M5416 Radiculopathy, lumbar region: Secondary | ICD-10-CM | POA: Diagnosis not present

## 2023-12-16 MED ORDER — MELOXICAM 7.5 MG PO TABS
7.5000 mg | ORAL_TABLET | Freq: Every day | ORAL | 0 refills | Status: DC
Start: 2023-12-16 — End: 2024-01-13

## 2023-12-16 NOTE — Progress Notes (Signed)
 Subjective:  Patient ID: Shawn Miller, male    DOB: 1966-06-27  Age: 58 y.o. 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 Usual job. Same lifting as usual and no change in job. Pain noticed with lifting box out of freezer - sharp pain shoot down R low back into R leg.  Some improvement past few weeks. Pain into thigh at times. Has continued to work - using tiger balm patches on back - some relief. Ibuprofen  at night - helps pain some. Pain still shooting into thigh. Needs time to heal - took this Friday and Saturday as sick days, off Sunday Monday, back to work on Tuesday, with lifting.   Trying home exercises, and stretches. No prior PT.  No bowel or bladder incontinence, no saddle anesthesia, no lower extremity weakness. No fever, wt loss or unexplained night sweats.  Mobic  last year - made drowsy (as does ibuprofen ), but helped for sx's at that time.     History There are no active problems to display for this patient.  Past Medical History:  Diagnosis Date   Anxiety    Headache    Past Surgical History:  Procedure Laterality Date   JOINT REPLACEMENT  2003   per patient neck-3 screws and 3 plate   posterior neck surgery     Allergies  Allergen Reactions   Gadolinium Derivatives Cough    Dr. Broadus Canes s/w patient after patient  began coughing post injection. Patient unsure if he just got choked or if it was the contrast. No treatment expect for 20 minute observation. ry   Prior to Admission medications   Medication Sig Start Date End Date Taking? Authorizing Provider  ALPRAZolam  (XANAX ) 0.25 MG tablet Take 0.5-1 tablets (0.125-0.25 mg total) by mouth daily as needed for anxiety. T  11/22/20  Yes Benjiman Bras, MD  meloxicam  (MOBIC ) 7.5 MG tablet TAKE 1 TABLET(7.5 MG) BY MOUTH DAILY 05/17/23  Yes Benjiman Bras, MD   Social History   Socioeconomic History   Marital status: Married    Spouse name: Not on file   Number of children: Not on file   Years of education: Not on file   Highest education level: Not on file  Occupational History   Occupation: transportation  Tobacco Use   Smoking status: Every Day    Current packs/day: 1.00    Average packs/day: 1 pack/day for 33.0 years (33.0 ttl pk-yrs)    Types: Cigarettes   Smokeless tobacco: Never  Substance and Sexual Activity   Alcohol use: Yes   Drug use: No   Sexual activity: Not on file  Other Topics Concern   Not on file  Social History Narrative   Exercise walking 2 miles two times/week   Social Drivers of Health   Financial Resource Strain: Not on file  Food Insecurity: Not on file  Transportation Needs: Not on file  Physical Activity: Not on file  Stress: Not on file  Social Connections: Unknown (01/09/2022)   Received from [facility], [facility]   Social Network    Social Network: Not on file  Intimate Partner Violence: Unknown (12/01/2021)   Received from [facility], [facility]   HITS    Physically Hurt: Not on file    Insult or Talk Down To: Not on file    Threaten Physical Harm: Not on file    Scream or Curse: Not on file    Review of Systems  Per HPI.  Objective:   Vitals:   12/16/23 1338  BP: 128/78  Pulse: (!) 101  Temp: 98 F (36.7 C)  TempSrc: Temporal  SpO2: 97%  Weight: 246 lb 3.2 oz (111.7 kg)  Height: 6\' 2"  (1.88 m)     Physical Exam Constitutional:      Appearance: He is well-developed.  HENT:     Head: Normocephalic and atraumatic.  Pulmonary:     Effort: Pulmonary effort is normal.  Abdominal:     Palpations: Abdomen is soft.     Tenderness: There is no abdominal tenderness.  Musculoskeletal:        General: Tenderness (Paraspinals,  right sided, somewhat to sciatic notch as well.  No midline bony tenderness or other bony tenderness.  Negative seated straight leg raise.  Able to heel and toe walk without difficulty.) present.     Cervical back: Normal range of motion.     Lumbar back: Spasms and tenderness present. No bony tenderness. Normal range of motion.  Skin:    General: Skin is warm and dry.  Neurological:     Mental Status: He is alert.     Sensory: No sensory deficit.     Deep Tendon Reflexes:     Reflex Scores:      Patellar reflexes are 2+ on the right side and 2+ on the left side.      Achilles reflexes are 2+ on the right side and 2+ on the left side.    Comments: Able to heel and toe walk without difficulty.  Psychiatric:        Behavior: Behavior normal.        Assessment & Plan:  Shawn Miller is a 58 y.o. male . Acute radicular low back pain - Plan: meloxicam  (MOBIC ) 7.5 MG tablet Reassuring exam, but history suggestive of radicular low back pain.  Will try meloxicam , monitor for sedation and avoid driving or operating machinery while taking if he does experience sedation as noted previously.  Note provided for work temporarily, return next week, but if specific restrictions or further time off needed may need to discuss with his employer or Worker's Comp. evaluation since pain noted with work.  Understanding expressed.  RTC precautions given.  Meds ordered this encounter  Medications   meloxicam  (MOBIC ) 7.5 MG tablet    Sig: Take 1 tablet (7.5 mg total) by mouth daily.    Dispense:  30 tablet    Refill:  0   Patient Instructions  See information below on the low back pain and treatment.  Glad to hear that is improving.  I think a few days off of work with use of anti-inflammatory would be reasonable to help this resolve further.  I did complete a note today, but if any specific restrictions are needed or not improving into next week, I would recommend discussing with your employer as they may  want you to be seen through Worker's Comp. Provider.   Meloxicam  once per day as needed, do not combine with ibuprofen .  Okay to use topical medication like Tiger balm while using meloxicam .  Follow-up if not improving and thank  you for coming in today.  Return to the clinic or go to the nearest emergency room if any of your symptoms worsen or new symptoms occur. Acute Back Pain, Adult Acute back pain is sudden and usually short-lived. It is often caused by an injury to the muscles and tissues in the back. The injury may result from: A muscle, tendon, or ligament getting overstretched or torn. Ligaments are tissues that connect bones to each other. Lifting something improperly can cause a back strain. Wear and tear (degeneration) of the spinal disks. Spinal disks are circular tissue that provide cushioning between the bones of the spine (vertebrae). Twisting motions, such as while playing sports or doing yard work. A hit to the back. Arthritis. You may have a physical exam, lab tests, and imaging tests to find the cause of your pain. Acute back pain usually goes away with rest and home care. Follow these instructions at home: Managing pain, stiffness, and swelling Take over-the-counter and prescription medicines only as told by your health care provider. Treatment may include medicines for pain and inflammation that are taken by mouth or applied to the skin, or muscle relaxants. Your health care provider may recommend applying ice during the first 24-48 hours after your pain starts. To do this: Put ice in a plastic bag. Place a towel between your skin and the bag. Leave the ice on for 20 minutes, 2-3 times a day. Remove the ice if your skin turns bright red. This is very important. If you cannot feel pain, heat, or cold, you have a greater risk of damage to the area. If directed, apply heat to the affected area as often as told by your health care provider. Use the heat source that your health  care provider recommends, such as a moist heat pack or a heating pad. Place a towel between your skin and the heat source. Leave the heat on for 20-30 minutes. Remove the heat if your skin turns bright red. This is especially important if you are unable to feel pain, heat, or cold. You have a greater risk of getting burned. Activity  Do not stay in bed. Staying in bed for more than 1-2 days can delay your recovery. Sit up and stand up straight. Avoid leaning forward when you sit or hunching over when you stand. If you work at a desk, sit close to it so you do not need to lean over. Keep your chin tucked in. Keep your neck drawn back, and keep your elbows bent at a 90-degree angle (right angle). Sit high and close to the steering wheel when you drive. Add lower back (lumbar) support to your car seat, if needed. Take short walks on even surfaces as soon as you are able. Try to increase the length of time you walk each day. Do not sit, drive, or stand in one place for more than 30 minutes at a time. Sitting or standing for long periods of time can put stress on your back. Do not drive or use heavy machinery while taking prescription pain medicine. Use proper lifting techniques. When you bend and lift, use positions that put less stress on your back: [place] your knees. Keep the load close to your body. Avoid twisting. Exercise regularly as told by your health care provider. Exercising helps your back heal faster and helps prevent back injuries by keeping muscles strong and flexible. Work with a physical therapist to make a safe exercise program, as recommended by your health care provider. Do  any exercises as told by your physical therapist. Lifestyle Maintain a healthy weight. Extra weight puts stress on your back and makes it difficult to have good posture. Avoid activities or situations that make you feel anxious or stressed. Stress and anxiety increase muscle tension and can make back pain worse.  Learn ways to manage anxiety and stress, such as through exercise. General instructions Sleep on a firm mattress in a comfortable position. Try lying on your side with your knees slightly bent. If you lie on your back, put a pillow under your knees. Keep your head and neck in a straight line with your spine (neutral position) when using electronic equipment like smartphones or pads. To do this: Raise your smartphone or pad to look at it instead of bending your head or neck to look down. Put the smartphone or pad at the level of your face while looking at the screen. Follow your treatment plan as told by your health care provider. This may include: Cognitive or behavioral therapy. Acupuncture or massage therapy. Meditation or yoga. Contact a health care provider if: You have pain that is not relieved with rest or medicine. You have increasing pain going down into your legs or buttocks. Your pain does not improve after 2 weeks. You have pain at night. You lose weight without trying. You have a fever or chills. You develop nausea or vomiting. You develop abdominal pain. Get help right away if: You develop new bowel or bladder control problems. You have unusual weakness or numbness in your arms or legs. You feel faint. These symptoms may represent a serious problem that is an emergency. Do not wait to see if the symptoms will go away. Get medical help right away. Call your local emergency services (911 in the [place] Do not drive yourself to the hospital. Summary Acute back pain is sudden and usually short-lived. Use proper lifting techniques. When you bend and lift, use positions that put less stress on your back. Take over-the-counter and prescription medicines only as told by your health care provider, and apply heat or ice as told. This information is not intended to replace advice given to you by your health care provider. Make sure you discuss any questions you have with your health care  provider. Document Revised: 11/08/2020 Document Reviewed: 11/08/2020 Elsevier Patient Education  2024 [employer]    Signed,   Caro Christmas, MD [facility], [facility] 12/16/23 2:33 PM

## 2023-12-16 NOTE — Patient Instructions (Signed)
 See information below on the low back pain and treatment.  Glad to hear that is improving.  I think a few days off of work with use of anti-inflammatory would be reasonable to help this resolve further.  I did complete a note today, but if any specific restrictions are needed or not improving into next week, I would recommend discussing with your employer as they may want you to be seen through Worker's Comp. Provider.   Meloxicam once per day as needed, do not combine with ibuprofen.  Okay to use topical medication like Tiger balm while using meloxicam.  Follow-up if not improving and thank you for coming in today.  Return to the clinic or go to the nearest emergency room if any of your symptoms worsen or new symptoms occur. Acute Back Pain, Adult Acute back pain is sudden and usually short-lived. It is often caused by an injury to the muscles and tissues in the back. The injury may result from: A muscle, tendon, or ligament getting overstretched or torn. Ligaments are tissues that connect bones to each other. Lifting something improperly can cause a back strain. Wear and tear (degeneration) of the spinal disks. Spinal disks are circular tissue that provide cushioning between the bones of the spine (vertebrae). Twisting motions, such as while playing sports or doing yard work. A hit to the back. Arthritis. You may have a physical exam, lab tests, and imaging tests to find the cause of your pain. Acute back pain usually goes away with rest and home care. Follow these instructions at home: Managing pain, stiffness, and swelling Take over-the-counter and prescription medicines only as told by your health care provider. Treatment may include medicines for pain and inflammation that are taken by mouth or applied to the skin, or muscle relaxants. Your health care provider may recommend applying ice during the first 24-48 hours after your pain starts. To do this: Put ice in a plastic bag. Place a towel  between your skin and the bag. Leave the ice on for 20 minutes, 2-3 times a day. Remove the ice if your skin turns bright red. This is very important. If you cannot feel pain, heat, or cold, you have a greater risk of damage to the area. If directed, apply heat to the affected area as often as told by your health care provider. Use the heat source that your health care provider recommends, such as a moist heat pack or a heating pad. Place a towel between your skin and the heat source. Leave the heat on for 20-30 minutes. Remove the heat if your skin turns bright red. This is especially important if you are unable to feel pain, heat, or cold. You have a greater risk of getting burned. Activity  Do not stay in bed. Staying in bed for more than 1-2 days can delay your recovery. Sit up and stand up straight. Avoid leaning forward when you sit or hunching over when you stand. If you work at a desk, sit close to it so you do not need to lean over. Keep your chin tucked in. Keep your neck drawn back, and keep your elbows bent at a 90-degree angle (right angle). Sit high and close to the steering wheel when you drive. Add lower back (lumbar) support to your car seat, if needed. Take short walks on even surfaces as soon as you are able. Try to increase the length of time you walk each day. Do not sit, drive, or stand in one place  for more than 30 minutes at a time. Sitting or standing for long periods of time can put stress on your back. Do not drive or use heavy machinery while taking prescription pain medicine. Use proper lifting techniques. When you bend and lift, use positions that put less stress on your back: [place] your knees. Keep the load close to your body. Avoid twisting. Exercise regularly as told by your health care provider. Exercising helps your back heal faster and helps prevent back injuries by keeping muscles strong and flexible. Work with a physical therapist to make a safe exercise  program, as recommended by your health care provider. Do any exercises as told by your physical therapist. Lifestyle Maintain a healthy weight. Extra weight puts stress on your back and makes it difficult to have good posture. Avoid activities or situations that make you feel anxious or stressed. Stress and anxiety increase muscle tension and can make back pain worse. Learn ways to manage anxiety and stress, such as through exercise. General instructions Sleep on a firm mattress in a comfortable position. Try lying on your side with your knees slightly bent. If you lie on your back, put a pillow under your knees. Keep your head and neck in a straight line with your spine (neutral position) when using electronic equipment like smartphones or pads. To do this: Raise your smartphone or pad to look at it instead of bending your head or neck to look down. Put the smartphone or pad at the level of your face while looking at the screen. Follow your treatment plan as told by your health care provider. This may include: Cognitive or behavioral therapy. Acupuncture or massage therapy. Meditation or yoga. Contact a health care provider if: You have pain that is not relieved with rest or medicine. You have increasing pain going down into your legs or buttocks. Your pain does not improve after 2 weeks. You have pain at night. You lose weight without trying. You have a fever or chills. You develop nausea or vomiting. You develop abdominal pain. Get help right away if: You develop new bowel or bladder control problems. You have unusual weakness or numbness in your arms or legs. You feel faint. These symptoms may represent a serious problem that is an emergency. Do not wait to see if the symptoms will go away. Get medical help right away. Call your local emergency services (911 in the [place] Do not drive yourself to the hospital. Summary Acute back pain is sudden and usually short-lived. Use proper  lifting techniques. When you bend and lift, use positions that put less stress on your back. Take over-the-counter and prescription medicines only as told by your health care provider, and apply heat or ice as told. This information is not intended to replace advice given to you by your health care provider. Make sure you discuss any questions you have with your health care provider. Document Revised: 11/08/2020 Document Reviewed: 11/08/2020 Elsevier Patient Education  2024 [employer].

## 2023-12-18 ENCOUNTER — Encounter: Payer: Self-pay | Admitting: Family Medicine

## 2024-01-13 ENCOUNTER — Other Ambulatory Visit: Payer: Self-pay | Admitting: Family Medicine

## 2024-01-13 DIAGNOSIS — M5416 Radiculopathy, lumbar region: Secondary | ICD-10-CM

## 2024-01-13 NOTE — Telephone Encounter (Signed)
 Requested Prescriptions   Pending Prescriptions Disp Refills   meloxicam  (MOBIC ) 7.5 MG tablet [Pharmacy Med Name: MELOXICAM  7.5MG  TABLETS] 30 tablet 0    Sig: TAKE 1 TABLET(7.5 MG) BY MOUTH DAILY     Date of patient request: 01/13/2024 Last office visit: 12/16/2023 Upcoming visit: 03/16/2024 Date of last refill: 12/16/2023 Last refill amount: 30

## 2024-02-17 ENCOUNTER — Other Ambulatory Visit: Payer: Self-pay | Admitting: Family Medicine

## 2024-02-17 DIAGNOSIS — M5416 Radiculopathy, lumbar region: Secondary | ICD-10-CM

## 2024-02-17 NOTE — Telephone Encounter (Signed)
 Requested Prescriptions   Pending Prescriptions Disp Refills   meloxicam  (MOBIC ) 7.5 MG tablet [Pharmacy Med Name: MELOXICAM  7.5MG  TABLETS] 30 tablet 0    Sig: TAKE 1 TABLET(7.5 MG) BY MOUTH DAILY     Date of patient request: 02/17/2024 Last office visit: 12/16/2023 Upcoming visit: 03/16/2024 Date of last refill: 01/13/2024 Last refill amount: 30

## 2024-02-17 NOTE — Telephone Encounter (Signed)
 I see an appointment scheduled for July 17.  Will refill meloxicam  temporarily until that appointment.  Thanks

## 2024-03-16 ENCOUNTER — Encounter: Admitting: Family Medicine

## 2024-05-18 ENCOUNTER — Encounter: Admitting: Family Medicine

## 2024-07-19 ENCOUNTER — Encounter: Payer: Self-pay | Admitting: Family Medicine

## 2024-07-19 ENCOUNTER — Ambulatory Visit (INDEPENDENT_AMBULATORY_CARE_PROVIDER_SITE_OTHER): Admitting: Family Medicine

## 2024-07-19 VITALS — BP 126/64 | HR 94 | Temp 98.3°F | Resp 12 | Ht 74.0 in | Wt 252.8 lb

## 2024-07-19 DIAGNOSIS — R142 Eructation: Secondary | ICD-10-CM | POA: Insufficient documentation

## 2024-07-19 DIAGNOSIS — Z1329 Encounter for screening for other suspected endocrine disorder: Secondary | ICD-10-CM

## 2024-07-19 DIAGNOSIS — Z130 Encounter for screening for diseases of the blood and blood-forming organs and certain disorders involving the immune mechanism: Secondary | ICD-10-CM | POA: Diagnosis not present

## 2024-07-19 DIAGNOSIS — Z13220 Encounter for screening for lipoid disorders: Secondary | ICD-10-CM

## 2024-07-19 DIAGNOSIS — Z125 Encounter for screening for malignant neoplasm of prostate: Secondary | ICD-10-CM

## 2024-07-19 DIAGNOSIS — Z87448 Personal history of other diseases of urinary system: Secondary | ICD-10-CM

## 2024-07-19 DIAGNOSIS — Z800 Family history of malignant neoplasm of digestive organs: Secondary | ICD-10-CM | POA: Insufficient documentation

## 2024-07-19 DIAGNOSIS — Z0000 Encounter for general adult medical examination without abnormal findings: Secondary | ICD-10-CM

## 2024-07-19 DIAGNOSIS — R1013 Epigastric pain: Secondary | ICD-10-CM | POA: Insufficient documentation

## 2024-07-19 DIAGNOSIS — Z1211 Encounter for screening for malignant neoplasm of colon: Secondary | ICD-10-CM | POA: Insufficient documentation

## 2024-07-19 DIAGNOSIS — Z131 Encounter for screening for diabetes mellitus: Secondary | ICD-10-CM

## 2024-07-19 DIAGNOSIS — Z720 Tobacco use: Secondary | ICD-10-CM

## 2024-07-19 DIAGNOSIS — R194 Change in bowel habit: Secondary | ICD-10-CM | POA: Insufficient documentation

## 2024-07-19 DIAGNOSIS — R140 Abdominal distension (gaseous): Secondary | ICD-10-CM | POA: Insufficient documentation

## 2024-07-19 LAB — LIPID PANEL
Cholesterol: 185 mg/dL (ref 0–200)
HDL: 46.5 mg/dL (ref >39.00)
LDL Cholesterol: 93 mg/dL (ref 0–99)
NonHDL: 138.93
Total CHOL/HDL Ratio: 4
Triglycerides: 229 mg/dL — ABNORMAL HIGH (ref 0.0–149.0)
VLDL: 45.8 mg/dL — ABNORMAL HIGH (ref 0.0–40.0)

## 2024-07-19 LAB — COMPREHENSIVE METABOLIC PANEL WITH GFR
ALT: 21 U/L (ref 0–53)
AST: 16 U/L (ref 0–37)
Albumin: 4.6 g/dL (ref 3.5–5.2)
Alkaline Phosphatase: 84 U/L (ref 39–117)
BUN: 10 mg/dL (ref 6–23)
CO2: 29 meq/L (ref 19–32)
Calcium: 9.5 mg/dL (ref 8.4–10.5)
Chloride: 102 meq/L (ref 96–112)
Creatinine, Ser: 1.04 mg/dL (ref 0.40–1.50)
GFR: 78.02 mL/min (ref >60.00)
Glucose, Bld: 79 mg/dL (ref 70–99)
Potassium: 4.2 meq/L (ref 3.5–5.1)
Sodium: 141 meq/L (ref 135–145)
Total Bilirubin: 0.4 mg/dL (ref 0.2–1.2)
Total Protein: 7.4 g/dL (ref 6.0–8.3)

## 2024-07-19 LAB — POCT URINALYSIS DIP (MANUAL ENTRY)
Bilirubin, UA: NEGATIVE
Blood, UA: NEGATIVE
Glucose, UA: NEGATIVE mg/dL
Ketones, POC UA: NEGATIVE mg/dL
Leukocytes, UA: NEGATIVE
Nitrite, UA: NEGATIVE
Protein Ur, POC: NEGATIVE mg/dL
Spec Grav, UA: 1.02 (ref 1.010–1.025)
Urobilinogen, UA: 0.2 U/dL
pH, UA: 6 (ref 5.0–8.0)

## 2024-07-19 LAB — CBC
HCT: 43.8 % (ref 39.0–52.0)
Hemoglobin: 14.3 g/dL (ref 13.0–17.0)
MCHC: 32.7 g/dL (ref 30.0–36.0)
MCV: 78.7 fl (ref 78.0–100.0)
Platelets: 339 K/uL (ref 150.0–400.0)
RBC: 5.57 Mil/uL (ref 4.22–5.81)
RDW: 16.9 % — ABNORMAL HIGH (ref 11.5–15.5)
WBC: 7.3 K/uL (ref 4.0–10.5)

## 2024-07-19 LAB — PSA: PSA: 1.21 ng/mL (ref 0.10–4.00)

## 2024-07-19 LAB — TSH: TSH: 0.89 u[IU]/mL (ref 0.35–5.50)

## 2024-07-19 NOTE — Progress Notes (Signed)
 Subjective:  Patient ID: Shawn Miller, male    DOB: 06-Jun-1966  Age: 58 y.o. MRN: [id number]  CC:  Chief Complaint  Patient presents with   Annual Exam    No questions or concerns    HPI Shawn Miller presents for Annual Exam Doing well.  Family trip to [facility] at Thanksgiving.   PCP, me  Radicular low back pain: Evaluated in April.  Improving at that time.  Short-term note provided for work, meloxicam  with RTC precautions.  Advised to discuss through his employer for possible Worker's Comp. if any persistent symptoms or restrictions needed.  Back pain improved - only needing ibuprofen  once per week. Still physical work.    History of hematuria Evaluated in 2022, referred to urology.  Urinalysis negative for blood on 11/27/2020, after trace red blood cells noted on 11/22/2020. Unable to see their notes, but told was ok after further testing- CT, cystoscopy.       07/19/2024    1:06 PM 01/18/2023    9:14 AM 12/09/2021   10:55 AM 11/22/2020    8:49 AM 07/04/2019    1:30 PM  Depression screen PHQ 2/9  Decreased Interest 0 0 0 0 0  Down, Depressed, Hopeless 0 0 0 0 0  PHQ - 2 Score 0 0 0 0 0  Altered sleeping 0 0 0    Tired, decreased energy 0 0 0    Change in appetite 0 0 0    Feeling bad or failure about yourself  0 0 0    Trouble concentrating 0 0 0    Moving slowly or fidgety/restless 0 0 0    Suicidal thoughts 0 0 0    PHQ-9 Score 0 0  0     Difficult doing work/chores Not difficult at all Not difficult at all Not difficult at all       Data saved with a previous flowsheet row definition    Health Maintenance  Topic Date Due   Lung Cancer Screening  Never done   COVID-19 Vaccine (1 - 2025-26 season) 08/04/2024 (Originally 05/01/2024)   Zoster Vaccines- Shingrix (1 of 2) 10/19/2024 (Originally 12/28/2013)   Influenza Vaccine  11/28/2024 (Originally 03/31/2024)   Pneumococcal Vaccine: 50+ Years (1 of 2 - PCV) 07/19/2025 (Originally 12/29/1982)   Colonoscopy   07/27/2026   DTaP/Tdap/Td (2 - Td or Tdap) 01/30/2029   Hepatitis C Screening  Completed   HIV Screening  Completed   Hepatitis B Vaccines 19-59 Average Risk  Aged Out   HPV VACCINES  Aged Out   Meningococcal B Vaccine  Aged Out  Colonoscopy - about 4 years ago. Dr. Kristie - plan to reshedule - due last year. No BRBPR or change in bowel movements.  Prostate: does not have family history of prostate cancer The natural history of prostate cancer and ongoing controversy regarding screening and potential treatment outcomes of prostate cancer has been discussed with the patient. The meaning of a false positive PSA and a false negative PSA has been discussed. He indicates understanding of the limitations of this screening test and wishes to proceed with screening PSA testing. Lab Results  Component Value Date   PSA1 0.7 01/31/2019   PSA 1.04 10/30/2015      Immunization History  Administered Date(s) Administered   Tdap 01/31/2019  Flu vaccine - declined.  Pneumonia, shingles, covid booster, hep B vaccines declined   No results found. Appt last year - new glasses.   Dental: every 6  months.   Alcohol: 4 per week  Tobacco: 1 pack per week.  1 pack per week past 20 years.  Has home treatment to quit. Trying to quit.   Exercise: physical work.    History Patient Active Problem List   Diagnosis Date Noted   Abdominal bloating 07/19/2024   Change in bowel habits 07/19/2024   Colon cancer screening 07/19/2024   Epigastric pain 07/19/2024   Family history of colon cancer 07/19/2024   Flatulence, eructation and gas pain 07/19/2024   Anxiety 12/01/2019   Chest pain in adult 12/28/2017   Tobacco abuse 12/28/2017   Past Medical History:  Diagnosis Date   Anxiety    Headache    Past Surgical History:  Procedure Laterality Date   JOINT REPLACEMENT  2003   per patient neck-3 screws and 3 plate   posterior neck surgery     Allergies  Allergen Reactions   Gadolinium Derivatives  Cough    Dr. Trudy s/w patient after patient  began coughing post injection. Patient unsure if he just got choked or if it was the contrast. No treatment expect for 20 minute observation. ry   Prior to Admission medications   Medication Sig Start Date End Date Taking? Authorizing Provider  ALPRAZolam  (XANAX ) 0.25 MG tablet Take 0.5-1 tablets (0.125-0.25 mg total) by mouth daily as needed for anxiety. T Patient not taking: Reported on 07/19/2024 11/22/20   Levora Reyes SAUNDERS, MD  meloxicam  (MOBIC ) 7.5 MG tablet TAKE 1 TABLET(7.5 MG) BY MOUTH DAILY Patient not taking: Reported on 07/19/2024 02/17/24   Levora Reyes SAUNDERS, MD   Social History   Socioeconomic History   Marital status: Married    Spouse name: Not on file   Number of children: Not on file   Years of education: Not on file   Highest education level: Not on file  Occupational History   Occupation: transportation  Tobacco Use   Smoking status: Every Day    Current packs/day: 1.00    Average packs/day: 1 pack/day for 33.0 years (33.0 ttl pk-yrs)    Types: Cigarettes   Smokeless tobacco: Never  Vaping Use   Vaping status: Never Used  Substance and Sexual Activity   Alcohol use: Yes   Drug use: No   Sexual activity: Not on file  Other Topics Concern   Not on file  Social History Narrative   Exercise walking 2 miles two times/week   Social Drivers of Health   Financial Resource Strain: Not on file  Food Insecurity: Not on file  Transportation Needs: Not on file  Physical Activity: Not on file  Stress: Not on file  Social Connections: Unknown (01/09/2022)   [facility]   Social Network    Social Network: Not on file  Intimate Partner Violence: Unknown (12/01/2021)   Received from [facility]   HITS    Physically Hurt: Not on file    Insult or Talk Down To: Not on file    Threaten Physical Harm: Not on file    Scream or Curse: Not on file    Review of Systems 13 point review of systems per  patient health survey noted.  Negative other than as indicated above or in HPI.    Objective:   Vitals:   07/19/24 1303  BP: 126/64  Pulse: 94  Resp: 12  Temp: 98.3 F (36.8 C)  TempSrc: Temporal  SpO2: 94%  Weight: 252 lb 12.8 oz (114.7 kg)  Height: 6' 2 (1.88 m)  Physical Exam Vitals reviewed.  Constitutional:      Appearance: He is well-developed.  HENT:     Head: Normocephalic and atraumatic.     Right Ear: External ear normal.     Left Ear: External ear normal.  Eyes:     Conjunctiva/sclera: Conjunctivae normal.     Pupils: Pupils are equal, round, and reactive to light.  Neck:     Thyroid : No thyromegaly.  Cardiovascular:     Rate and Rhythm: Normal rate and regular rhythm.     Heart sounds: Normal heart sounds.  Pulmonary:     Effort: Pulmonary effort is normal. No respiratory distress.     Breath sounds: Normal breath sounds. No wheezing.  Abdominal:     General: There is no distension.     Palpations: Abdomen is soft.     Tenderness: There is no abdominal tenderness.  Musculoskeletal:        General: No tenderness. Normal range of motion.     Cervical back: Normal range of motion and neck supple.  Lymphadenopathy:     Cervical: No cervical adenopathy.  Skin:    General: Skin is warm and dry.  Neurological:     Mental Status: He is alert and oriented to person, place, and time.     Deep Tendon Reflexes: Reflexes are normal and symmetric.  Psychiatric:        Behavior: Behavior normal.      Results for orders placed or performed in visit on 07/19/24  POCT urinalysis dipstick   Collection Time: 07/19/24  1:57 PM  Result Value Ref Range   Color, UA straw (A) yellow   Clarity, UA clear clear   Glucose, UA negative negative mg/dL   Bilirubin, UA negative negative   Ketones, POC UA negative negative mg/dL   Spec Grav, UA 8.979 8.989 - 1.025   Blood, UA negative negative   pH, UA 6.0 5.0 - 8.0   Protein Ur, POC negative negative mg/dL    Urobilinogen, UA 0.2 0.2 or 1.0 E.U./dL   Nitrite, UA Negative Negative   Leukocytes, UA Negative Negative     Assessment & Plan:  Shawn Miller is a 58 y.o. male . Annual physical exam  - -anticipatory guidance as below in AVS, screening labs above. Health maintenance items as above in HPI discussed/recommended as applicable.   Screening for hyperlipidemia - Plan: Lipid panel  Screening for diabetes mellitus - Plan: Comprehensive metabolic panel with GFR, Hemoglobin A1c  Screening, anemia, deficiency, iron - Plan: CBC  Screening for prostate cancer - Plan: PSA  Screening for thyroid  disorder - Plan: TSH  History of hematuria - Plan: POCT urinalysis dipstick  Nicotine use Commended on cutting back working towards quitting.  Handout given with coping techniques.  Advised to let me know if I can be of assistance.   No orders of the defined types were placed in this encounter.  Patient Instructions  Thank you for coming in today. No change in medications at this time. If there are any concerns on your bloodwork, I will let you know. Take care!  Keep up the good work with cutting back on smoking and quitting. Let me know if you need some help in quitting.  Preventive Care 56-16 Years Old, Male Preventive care refers to lifestyle choices and visits with your health care provider that can promote health and wellness. Preventive care visits are also called wellness exams. What can I expect for my preventive care visit? Counseling During  your preventive care visit, your health care provider may ask about your: Medical history, including: Past medical problems. Family medical history. Current health, including: Emotional well-being. Home life and relationship well-being. Sexual activity. Lifestyle, including: Alcohol, nicotine or tobacco, and drug use. Access to firearms. Diet, exercise, and sleep habits. Safety issues such as seatbelt and bike helmet use. Sunscreen  use. Work and work astronomer. Physical exam Your health care provider will check your: Height and weight. These may be used to calculate your BMI (body mass index). BMI is a measurement that tells if you are at a healthy weight. Waist circumference. This measures the distance around your waistline. This measurement also tells if you are at a healthy weight and may help predict your risk of certain diseases, such as type 2 diabetes and high blood pressure. Heart rate and blood pressure. Body temperature. Skin for abnormal spots. What immunizations do I need?  Vaccines are usually given at various ages, according to a schedule. Your health care provider will recommend vaccines for you based on your age, medical history, and lifestyle or other factors, such as travel or where you work. What tests do I need? Screening Your health care provider may recommend screening tests for certain conditions. This may include: Lipid and cholesterol levels. Diabetes screening. This is done by checking your blood sugar (glucose) after you have not eaten for a while (fasting). Hepatitis B test. Hepatitis C test. HIV (human immunodeficiency virus) test. STI (sexually transmitted infection) testing, if you are at risk. Lung cancer screening. Prostate cancer screening. Colorectal cancer screening. Talk with your health care provider about your test results, treatment options, and if necessary, the need for more tests. Follow these instructions at home: Eating and drinking  Eat a diet that includes fresh fruits and vegetables, whole grains, lean protein, and low-fat dairy products. Take vitamin and mineral supplements as recommended by your health care provider. Do not drink alcohol if your health care provider tells you not to drink. If you drink alcohol: Limit how much you have to 0-2 drinks a day. Know how much alcohol is in your drink. In the [place] one drink equals one 12 oz bottle of beer (355 mL),  one 5 oz glass of wine (148 mL), or one 1 oz glass of hard liquor (44 mL). Lifestyle Brush your teeth every morning and night with fluoride toothpaste. Floss one time each day. Exercise for at least 30 minutes 5 or more days each week. Do not use any products that contain nicotine or tobacco. These products include cigarettes, chewing tobacco, and vaping devices, such as e-cigarettes. If you need help quitting, ask your health care provider. Do not use drugs. If you are sexually active, practice safe sex. Use a condom or other form of protection to prevent STIs. Take aspirin only as told by your health care provider. Make sure that you understand how much to take and what form to take. Work with your health care provider to find out whether it is safe and beneficial for you to take aspirin daily. Find healthy ways to manage stress, such as: Meditation, yoga, or listening to music. Journaling. Talking to a trusted person. Spending time with friends and family. Minimize exposure to UV radiation to reduce your risk of skin cancer. Safety Always wear your seat belt while driving or riding in a vehicle. Do not drive: If you have been drinking alcohol. Do not ride with someone who has been drinking. When you are tired or distracted. While  texting. If you have been using any mind-altering substances or drugs. Wear a helmet and other protective equipment during sports activities. If you have firearms in your house, make sure you follow all gun safety procedures. What's next? Go to your health care provider once a year for an annual wellness visit. Ask your health care provider how often you should have your eyes and teeth checked. Stay up to date on all vaccines. This information is not intended to replace advice given to you by your health care provider. Make sure you discuss any questions you have with your health care provider. Document Revised: 02/12/2021 Document Reviewed:  02/12/2021 Elsevier Patient Education  2024 Elsevier Inc.  Managing the Challenge of Quitting Smoking Quitting smoking is a physical and mental challenge. You may have cravings, withdrawal symptoms, and temptation to smoke. Before quitting, work with your health care provider to make a plan that can help you manage quitting. Making a plan before you quit may keep you from smoking when you have the urge to smoke while trying to quit. How to manage lifestyle changes Managing stress Stress can make you want to smoke, and wanting to smoke may cause stress. It is important to find ways to manage your stress. You could try some of the following: Practice relaxation techniques. Breathe slowly and deeply, in through your nose and out through your mouth. Listen to music. Soak in a bath or take a shower. Imagine a peaceful place or vacation. Get some support. Talk with family or friends about your stress. Join a support group. Talk with a counselor or therapist. Get some physical activity. Go for a walk, run, or bike ride. Play a favorite sport. Practice yoga.  Medicines Talk with your health care provider about medicines that might help you deal with cravings and make quitting easier for you. Relationships Social situations can be difficult when you are quitting smoking. To manage this, you can: Avoid parties and other social situations where people might be smoking. Avoid alcohol. Leave right away if you have the urge to smoke. Explain to your family and friends that you are quitting smoking. Ask for support and let them know you might be a bit grumpy. Plan activities where smoking is not an option. General instructions Be aware that many people gain weight after they quit smoking. However, not everyone does. To keep from gaining weight, have a plan in place before you quit, and stick to the plan after you quit. Your plan should include: Eating healthy snacks. When you have a craving, it  may help to: Eat popcorn, or try carrots, celery, or other cut vegetables. Chew sugar-free gum. Changing how you eat. Eat small portion sizes at meals. Eat 4-6 small meals throughout the day instead of 1-2 large meals a day. Be mindful when you eat. You should avoid watching television or doing other things that might distract you as you eat. Exercising regularly. Make time to exercise each day. If you do not have time for a long workout, do short bouts of exercise for 5-10 minutes several times a day. Do some form of strengthening exercise, such as weight lifting. Do some exercise that gets your heart beating and causes you to breathe deeply, such as walking fast, running, swimming, or biking. This is very important. Drinking plenty of water or other low-calorie or no-calorie drinks. Drink enough fluid to keep your urine pale yellow.  How to recognize withdrawal symptoms Your body and mind may experience discomfort as you try  to get used to not having nicotine in your system. These effects are called withdrawal symptoms. They may include: Feeling hungrier than normal. Having trouble concentrating. Feeling irritable or restless. Having trouble sleeping. Feeling depressed. Craving a cigarette. These symptoms may surprise you, but they are normal to have when quitting smoking. To manage withdrawal symptoms: Avoid places, people, and activities that trigger your cravings. Remember why you want to quit. Get plenty of sleep. Avoid coffee and other drinks that contain caffeine. These may worsen some of your symptoms. How to manage cravings Come up with a plan for how to deal with your cravings. The plan should include the following: A definition of the specific situation you want to deal with. An activity or action you will take to replace smoking. A clear idea for how this action will help. The name of someone who could help you with this. Cravings usually last for 5-10 minutes. Consider  taking the following actions to help you with your plan to deal with cravings: Keep your mouth busy. Chew sugar-free gum. Suck on hard candies or a straw. Brush your teeth. Keep your hands and body busy. Change to a different activity right away. Squeeze or play with a ball. Do an activity or a hobby, such as making bead jewelry, practicing needlepoint, or working with wood. Mix up your normal routine. Take a short exercise break. Go for a quick walk, or run up and down stairs. Focus on doing something kind or helpful for someone else. Call a friend or family member to talk during a craving. Join a support group. Contact a quitline. Where to find support To get help or find a support group: Call the National Cancer Institute's Smoking Quitline: 1-800-QUIT-NOW 7273543231) Text QUIT to SmokefreeTXT: 521151 Where to find more information Visit these websites to find more information on quitting smoking: [place]. Department of Health and Human Services: www.smokefree.gov American Lung Association: www.freedomfromsmoking.org Centers for Disease Control and Prevention (CDC): footballexhibition.com.br American Heart Association: www.heart.org Contact a health care provider if: You want to change your plan for quitting. The medicines you are taking are not helping. Your eating feels out of control or you cannot sleep. You feel depressed or become very anxious. Summary Quitting smoking is a physical and mental challenge. You will face cravings, withdrawal symptoms, and temptation to smoke again. Preparation can help you as you go through these challenges. Try different techniques to manage stress, handle social situations, and prevent weight gain. You can deal with cravings by keeping your mouth busy (such as by chewing gum), keeping your hands and body busy, calling family or friends, or contacting a quitline for people who want to quit smoking. You can deal with withdrawal symptoms by avoiding places where  people smoke, getting plenty of rest, and avoiding drinks that contain caffeine. This information is not intended to replace advice given to you by your health care provider. Make sure you discuss any questions you have with your health care provider. Document Revised: 08/08/2021 Document Reviewed: 08/08/2021 Elsevier Patient Education  2024 Elsevier Inc.    Signed,   Reyes Pines, MD [facility], [facility] 07/19/24 1:32 PM

## 2024-07-19 NOTE — Patient Instructions (Addendum)
 Thank you for coming in today. No change in medications at this time. If there are any concerns on your bloodwork, I will let you know. Take care!  Keep up the good work with cutting back on smoking and quitting. Let me know if you need some help in quitting.  Preventive Care 72-60 Years Old, Male Preventive care refers to lifestyle choices and visits with your health care provider that can promote health and wellness. Preventive care visits are also called wellness exams. What can I expect for my preventive care visit? Counseling During your preventive care visit, your health care provider may ask about your: Medical history, including: Past medical problems. Family medical history. Current health, including: Emotional well-being. Home life and relationship well-being. Sexual activity. Lifestyle, including: Alcohol, nicotine or tobacco, and drug use. Access to firearms. Diet, exercise, and sleep habits. Safety issues such as seatbelt and bike helmet use. Sunscreen use. Work and work astronomer. Physical exam Your health care provider will check your: Height and weight. These may be used to calculate your BMI (body mass index). BMI is a measurement that tells if you are at a healthy weight. Waist circumference. This measures the distance around your waistline. This measurement also tells if you are at a healthy weight and may help predict your risk of certain diseases, such as type 2 diabetes and high blood pressure. Heart rate and blood pressure. Body temperature. Skin for abnormal spots. What immunizations do I need?  Vaccines are usually given at various ages, according to a schedule. Your health care provider will recommend vaccines for you based on your age, medical history, and lifestyle or other factors, such as travel or where you work. What tests do I need? Screening Your health care provider may recommend screening tests for certain conditions. This may include: Lipid  and cholesterol levels. Diabetes screening. This is done by checking your blood sugar (glucose) after you have not eaten for a while (fasting). Hepatitis B test. Hepatitis C test. HIV (human immunodeficiency virus) test. STI (sexually transmitted infection) testing, if you are at risk. Lung cancer screening. Prostate cancer screening. Colorectal cancer screening. Talk with your health care provider about your test results, treatment options, and if necessary, the need for more tests. Follow these instructions at home: Eating and drinking  Eat a diet that includes fresh fruits and vegetables, whole grains, lean protein, and low-fat dairy products. Take vitamin and mineral supplements as recommended by your health care provider. Do not drink alcohol if your health care provider tells you not to drink. If you drink alcohol: Limit how much you have to 0-2 drinks a day. Know how much alcohol is in your drink. In the [place] one drink equals one 12 oz bottle of beer (355 mL), one 5 oz glass of wine (148 mL), or one 1 oz glass of hard liquor (44 mL). Lifestyle Brush your teeth every morning and night with fluoride toothpaste. Floss one time each day. Exercise for at least 30 minutes 5 or more days each week. Do not use any products that contain nicotine or tobacco. These products include cigarettes, chewing tobacco, and vaping devices, such as e-cigarettes. If you need help quitting, ask your health care provider. Do not use drugs. If you are sexually active, practice safe sex. Use a condom or other form of protection to prevent STIs. Take aspirin only as told by your health care provider. Make sure that you understand how much to take and what form to take. Work with  your health care provider to find out whether it is safe and beneficial for you to take aspirin daily. Find healthy ways to manage stress, such as: Meditation, yoga, or listening to music. Journaling. Talking to a trusted  person. Spending time with friends and family. Minimize exposure to UV radiation to reduce your risk of skin cancer. Safety Always wear your seat belt while driving or riding in a vehicle. Do not drive: If you have been drinking alcohol. Do not ride with someone who has been drinking. When you are tired or distracted. While texting. If you have been using any mind-altering substances or drugs. Wear a helmet and other protective equipment during sports activities. If you have firearms in your house, make sure you follow all gun safety procedures. What's next? Go to your health care provider once a year for an annual wellness visit. Ask your health care provider how often you should have your eyes and teeth checked. Stay up to date on all vaccines. This information is not intended to replace advice given to you by your health care provider. Make sure you discuss any questions you have with your health care provider. Document Revised: 02/12/2021 Document Reviewed: 02/12/2021 Elsevier Patient Education  2024 [employer]  Managing the Challenge of Quitting Smoking Quitting smoking is a physical and mental challenge. You may have cravings, withdrawal symptoms, and temptation to smoke. Before quitting, work with your health care provider to make a plan that can help you manage quitting. Making a plan before you quit may keep you from smoking when you have the urge to smoke while trying to quit. How to manage lifestyle changes Managing stress Stress can make you want to smoke, and wanting to smoke may cause stress. It is important to find ways to manage your stress. You could try some of the following: Practice relaxation techniques. Breathe slowly and deeply, in through your nose and out through your mouth. Listen to music. Soak in a bath or take a shower. Imagine a peaceful place or vacation. Get some support. Talk with family or friends about your stress. Join a support group. Talk  with a counselor or therapist. Get some physical activity. Go for a walk, run, or bike ride. Play a favorite sport. Practice yoga.  Medicines Talk with your health care provider about medicines that might help you deal with cravings and make quitting easier for you. Relationships Social situations can be difficult when you are quitting smoking. To manage this, you can: Avoid parties and other social situations where people might be smoking. Avoid alcohol. Leave right away if you have the urge to smoke. Explain to your family and friends that you are quitting smoking. Ask for support and let them know you might be a bit grumpy. Plan activities where smoking is not an option. General instructions Be aware that many people gain weight after they quit smoking. However, not everyone does. To keep from gaining weight, have a plan in place before you quit, and stick to the plan after you quit. Your plan should include: Eating healthy snacks. When you have a craving, it may help to: Eat popcorn, or try carrots, celery, or other cut vegetables. Chew sugar-free gum. Changing how you eat. Eat small portion sizes at meals. Eat 4-6 small meals throughout the day instead of 1-2 large meals a day. Be mindful when you eat. You should avoid watching television or doing other things that might distract you as you eat. Exercising regularly. Make time to exercise  each day. If you do not have time for a long workout, do short bouts of exercise for 5-10 minutes several times a day. Do some form of strengthening exercise, such as weight lifting. Do some exercise that gets your heart beating and causes you to breathe deeply, such as walking fast, running, swimming, or biking. This is very important. Drinking plenty of water or other low-calorie or no-calorie drinks. Drink enough fluid to keep your urine pale yellow.  How to recognize withdrawal symptoms Your body and mind may experience discomfort as you try  to get used to not having nicotine in your system. These effects are called withdrawal symptoms. They may include: Feeling hungrier than normal. Having trouble concentrating. Feeling irritable or restless. Having trouble sleeping. Feeling depressed. Craving a cigarette. These symptoms may surprise you, but they are normal to have when quitting smoking. To manage withdrawal symptoms: Avoid places, people, and activities that trigger your cravings. Remember why you want to quit. Get plenty of sleep. Avoid coffee and other drinks that contain caffeine. These may worsen some of your symptoms. How to manage cravings Come up with a plan for how to deal with your cravings. The plan should include the following: A definition of the specific situation you want to deal with. An activity or action you will take to replace smoking. A clear idea for how this action will help. The name of someone who could help you with this. Cravings usually last for 5-10 minutes. Consider taking the following actions to help you with your plan to deal with cravings: Keep your mouth busy. Chew sugar-free gum. Suck on hard candies or a straw. Brush your teeth. Keep your hands and body busy. Change to a different activity right away. Squeeze or play with a ball. Do an activity or a hobby, such as making bead jewelry, practicing needlepoint, or working with wood. Mix up your normal routine. Take a short exercise break. Go for a quick walk, or run up and down stairs. Focus on doing something kind or helpful for someone else. Call a friend or family member to talk during a craving. Join a support group. Contact a quitline. Where to find support To get help or find a support group: Call the National Cancer Institute's Smoking Quitline: 1-800-QUIT-NOW (520)385-5932) Text QUIT to SmokefreeTXT: 521151 Where to find more information Visit these websites to find more information on quitting smoking: [place] Department of  Health and Human Services: www.smokefree.gov American Lung Association: www.freedomfromsmoking.org Centers for Disease Control and Prevention (CDC): footballexhibition.com.br American Heart Association: www.heart.org Contact a health care provider if: You want to change your plan for quitting. The medicines you are taking are not helping. Your eating feels out of control or you cannot sleep. You feel depressed or become very anxious. Summary Quitting smoking is a physical and mental challenge. You will face cravings, withdrawal symptoms, and temptation to smoke again. Preparation can help you as you go through these challenges. Try different techniques to manage stress, handle social situations, and prevent weight gain. You can deal with cravings by keeping your mouth busy (such as by chewing gum), keeping your hands and body busy, calling family or friends, or contacting a quitline for people who want to quit smoking. You can deal with withdrawal symptoms by avoiding places where people smoke, getting plenty of rest, and avoiding drinks that contain caffeine. This information is not intended to replace advice given to you by your health care provider. Make sure you discuss any questions you  have with your health care provider. Document Revised: 08/08/2021 Document Reviewed: 08/08/2021 Elsevier Patient Education  2024 [employer].

## 2024-07-20 LAB — HEMOGLOBIN A1C: Hgb A1c MFr Bld: 6.2 % (ref 4.6–6.5)

## 2024-07-25 ENCOUNTER — Ambulatory Visit: Payer: Self-pay | Admitting: Family Medicine

## 2024-07-26 NOTE — Progress Notes (Signed)
 Left a detailed message. Okay per DPR. Patient needs to scheduled 6 month visit when calling back

## 2024-07-31 NOTE — Progress Notes (Signed)
 Lab results have been discussed.   Verbalized understanding? Yes  Are there any questions? No   Scheduled 6 month follow up.

## 2025-02-02 ENCOUNTER — Ambulatory Visit: Admitting: Family Medicine
